# Patient Record
Sex: Male | Born: 1985 | Race: White | Hispanic: No | Marital: Married | Smoking: Former smoker
Health system: Southern US, Community
[De-identification: ages and names within clinical notes are randomized; demographics above are authoritative.]

## PROBLEM LIST (undated history)

## (undated) DIAGNOSIS — R7303 Prediabetes: Secondary | ICD-10-CM

## (undated) DIAGNOSIS — M8700 Idiopathic aseptic necrosis of unspecified bone: Secondary | ICD-10-CM

## (undated) DIAGNOSIS — Z87442 Personal history of urinary calculi: Secondary | ICD-10-CM

## (undated) DIAGNOSIS — G43909 Migraine, unspecified, not intractable, without status migrainosus: Secondary | ICD-10-CM

## (undated) DIAGNOSIS — F32A Depression, unspecified: Secondary | ICD-10-CM

## (undated) DIAGNOSIS — M1990 Unspecified osteoarthritis, unspecified site: Secondary | ICD-10-CM

## (undated) DIAGNOSIS — M457 Ankylosing spondylitis of lumbosacral region: Secondary | ICD-10-CM

## (undated) DIAGNOSIS — F1190 Opioid use, unspecified, uncomplicated: Secondary | ICD-10-CM

## (undated) DIAGNOSIS — G894 Chronic pain syndrome: Secondary | ICD-10-CM

## (undated) DIAGNOSIS — M25551 Pain in right hip: Secondary | ICD-10-CM

## (undated) DIAGNOSIS — F419 Anxiety disorder, unspecified: Secondary | ICD-10-CM

## (undated) HISTORY — PX: HIP SURGERY: SHX245

## (undated) HISTORY — DX: Migraine, unspecified, not intractable, without status migrainosus: G43.909

## (undated) HISTORY — PX: ADENOIDECTOMY: SUR15

---
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
## 2012-01-28 ENCOUNTER — Emergency Department: Payer: Self-pay | Admitting: Emergency Medicine

## 2013-01-21 ENCOUNTER — Emergency Department: Payer: Self-pay | Admitting: Emergency Medicine

## 2013-01-23 ENCOUNTER — Emergency Department: Payer: Self-pay | Admitting: Emergency Medicine

## 2013-01-23 LAB — CBC WITH DIFFERENTIAL/PLATELET
Basophil %: 0.7 %
Eosinophil #: 0 10*3/uL (ref 0.0–0.7)
Eosinophil %: 0.6 %
HCT: 46 % (ref 40.0–52.0)
HGB: 15.6 g/dL (ref 13.0–18.0)
Lymphocyte #: 3.2 10*3/uL (ref 1.0–3.6)
Lymphocyte %: 42.6 %
MCHC: 33.8 g/dL (ref 32.0–36.0)
Monocyte #: 0.4 x10 3/mm (ref 0.2–1.0)
Monocyte %: 5.5 %
Neutrophil #: 3.8 10*3/uL (ref 1.4–6.5)
Neutrophil %: 50.6 %
Platelet: 159 10*3/uL (ref 150–440)
RBC: 5.32 10*6/uL (ref 4.40–5.90)
RDW: 13.4 % (ref 11.5–14.5)

## 2013-01-23 LAB — DRUG SCREEN, URINE
Amphetamines, Ur Screen: NEGATIVE (ref ?–1000)
Cannabinoid 50 Ng, Ur ~~LOC~~: NEGATIVE (ref ?–50)
Opiate, Ur Screen: NEGATIVE (ref ?–300)
Phencyclidine (PCP) Ur S: NEGATIVE (ref ?–25)
Tricyclic, Ur Screen: NEGATIVE (ref ?–1000)

## 2013-01-23 LAB — BASIC METABOLIC PANEL
Anion Gap: 4 — ABNORMAL LOW (ref 7–16)
Calcium, Total: 9.4 mg/dL (ref 8.5–10.1)
Chloride: 107 mmol/L (ref 98–107)
Co2: 30 mmol/L (ref 21–32)
Creatinine: 1.11 mg/dL (ref 0.60–1.30)
EGFR (African American): >60
Glucose: 92 mg/dL (ref 65–99)
Osmolality: 280 (ref 275–301)
Potassium: 3.8 mmol/L (ref 3.5–5.1)

## 2013-01-23 LAB — URINALYSIS, COMPLETE
Bacteria: NONE SEEN
Bilirubin,UR: NEGATIVE
Leukocyte Esterase: NEGATIVE
Nitrite: NEGATIVE
Ph: 7 (ref 4.5–8.0)
Specific Gravity: 1.005 (ref 1.003–1.030)

## 2013-01-24 ENCOUNTER — Emergency Department: Payer: Self-pay | Admitting: Emergency Medicine

## 2013-07-15 ENCOUNTER — Emergency Department: Payer: Self-pay | Admitting: Emergency Medicine

## 2013-08-25 ENCOUNTER — Emergency Department: Payer: Self-pay | Admitting: Emergency Medicine

## 2013-08-27 ENCOUNTER — Emergency Department: Payer: Self-pay | Admitting: Emergency Medicine

## 2013-09-01 ENCOUNTER — Emergency Department: Payer: Self-pay | Admitting: Emergency Medicine

## 2013-09-05 DIAGNOSIS — Z720 Tobacco use: Secondary | ICD-10-CM | POA: Insufficient documentation

## 2013-09-05 DIAGNOSIS — R519 Headache, unspecified: Secondary | ICD-10-CM | POA: Insufficient documentation

## 2013-09-05 DIAGNOSIS — G479 Sleep disorder, unspecified: Secondary | ICD-10-CM | POA: Insufficient documentation

## 2013-09-05 DIAGNOSIS — R51 Headache: Secondary | ICD-10-CM

## 2013-09-11 ENCOUNTER — Emergency Department: Payer: Self-pay | Admitting: Emergency Medicine

## 2013-10-02 ENCOUNTER — Emergency Department: Payer: Self-pay | Admitting: Emergency Medicine

## 2013-11-07 ENCOUNTER — Emergency Department: Payer: Self-pay | Admitting: Emergency Medicine

## 2013-11-07 LAB — CBC WITH DIFFERENTIAL/PLATELET
BASOS ABS: 0.1 10*3/uL (ref 0.0–0.1)
Basophil %: 0.5 %
EOS ABS: 0.1 10*3/uL (ref 0.0–0.7)
Eosinophil %: 0.6 %
HCT: 48.9 % (ref 40.0–52.0)
HGB: 16.3 g/dL (ref 13.0–18.0)
LYMPHS ABS: 3.5 10*3/uL (ref 1.0–3.6)
LYMPHS PCT: 21.7 %
MCH: 29.4 pg (ref 26.0–34.0)
MCHC: 33.3 g/dL (ref 32.0–36.0)
MCV: 88 fL (ref 80–100)
Monocyte #: 0.9 x10 3/mm (ref 0.2–1.0)
Monocyte %: 5.3 %
NEUTROS PCT: 71.9 %
Neutrophil #: 11.7 10*3/uL — ABNORMAL HIGH (ref 1.4–6.5)
PLATELETS: 196 10*3/uL (ref 150–440)
RBC: 5.56 10*6/uL (ref 4.40–5.90)
RDW: 14.4 % (ref 11.5–14.5)
WBC: 16.2 10*3/uL — AB (ref 3.8–10.6)

## 2013-11-07 LAB — COMPREHENSIVE METABOLIC PANEL
ALT: 15 U/L
AST: 10 U/L — AB (ref 15–37)
Albumin: 4.5 g/dL (ref 3.4–5.0)
Alkaline Phosphatase: 61 U/L
Anion Gap: 3 — ABNORMAL LOW (ref 7–16)
BUN: 4 mg/dL — AB (ref 7–18)
Bilirubin,Total: 0.6 mg/dL (ref 0.2–1.0)
CHLORIDE: 106 mmol/L (ref 98–107)
CO2: 31 mmol/L (ref 21–32)
Calcium, Total: 9.5 mg/dL (ref 8.5–10.1)
Creatinine: 1.23 mg/dL (ref 0.60–1.30)
Glucose: 102 mg/dL — ABNORMAL HIGH (ref 65–99)
OSMOLALITY: 276 (ref 275–301)
Potassium: 4.3 mmol/L (ref 3.5–5.1)
Sodium: 140 mmol/L (ref 136–145)
Total Protein: 7.4 g/dL (ref 6.4–8.2)

## 2013-11-07 LAB — URINALYSIS, COMPLETE
Bacteria: NONE SEEN
Bilirubin,UR: NEGATIVE
Blood: NEGATIVE
Glucose,UR: NEGATIVE mg/dL (ref 0–75)
KETONE: NEGATIVE
Leukocyte Esterase: NEGATIVE
Nitrite: NEGATIVE
Ph: 8 (ref 4.5–8.0)
Protein: NEGATIVE
Specific Gravity: 1.001 (ref 1.003–1.030)
WBC UR: NONE SEEN /HPF (ref 0–5)

## 2013-11-07 LAB — LIPASE, BLOOD: Lipase: 146 U/L (ref 73–393)

## 2013-12-18 ENCOUNTER — Ambulatory Visit: Payer: Self-pay

## 2013-12-25 ENCOUNTER — Ambulatory Visit: Payer: Self-pay | Admitting: Nurse Practitioner

## 2014-03-13 DIAGNOSIS — G43909 Migraine, unspecified, not intractable, without status migrainosus: Secondary | ICD-10-CM | POA: Insufficient documentation

## 2014-05-14 ENCOUNTER — Encounter
Admit: 2014-05-14 | Disposition: A | Discharge status: Home / Self Care | Payer: Self-pay | Attending: Orthopedic Surgery | Admitting: Orthopedic Surgery

## 2014-06-04 ENCOUNTER — Ambulatory Visit: Payer: BLUE CROSS/BLUE SHIELD | Attending: Orthopedic Surgery | Admitting: Physical Therapy

## 2014-06-04 ENCOUNTER — Encounter: Payer: Self-pay | Admitting: Physical Therapy

## 2014-06-04 DIAGNOSIS — R262 Difficulty in walking, not elsewhere classified: Secondary | ICD-10-CM

## 2014-06-04 DIAGNOSIS — M6281 Muscle weakness (generalized): Secondary | ICD-10-CM

## 2014-06-04 DIAGNOSIS — M25552 Pain in left hip: Secondary | ICD-10-CM | POA: Insufficient documentation

## 2014-06-04 NOTE — Therapy (Addendum)
[place] MAIN [facility] SERVICES [place], [place], [address] Phone: [phone]   Fax:  [phone]  Physical Therapy Treatment  Patient Details  Name: Carl Solis MRN: [id number] Date of Birth: 20-Oct-1985 Referring Provider:  Rozelle Logan, Lavon Paganini, MD  Encounter Date: 06/04/2014      PT End of Session - 06/04/14 1456    Visit Number 3   Number of Visits 16   Date for PT Re-Evaluation 07/09/14   PT Start Time 1254   PT Stop Time 1339   PT Time Calculation (min) 45 min   Activity Tolerance Patient tolerated treatment well   Behavior During Therapy [facility] for tasks assessed/performed      Past Medical History  Diagnosis Date  . Migraine     Past Surgical History  Procedure Laterality Date  . Adenoidectomy      There were no vitals filed for this visit.  Visit Diagnosis:  Difficulty in walking  Muscle weakness (generalized)      Subjective Assessment - 06/04/14 1307    Subjective Pt is very satisfied with his progress and states since his hip "popped" he has not had any further issues. Pt states he would like a home exercise program for maintenance, but is otherwise ready for discharge.    Pertinent History Pt had left hip decompression in February of 2016.   Patient Stated Goals Pt stated he was now walking the dogs and performing all activities of daily living that he was hoping for.    Currently in Pain? No/denies            [facility] PT Assessment - 06/04/14 0001    Assessment   Medical Diagnosis Left core decompression   Onset Date 02/12/14   Balance Screen   Has the patient fallen in the past 6 months No   Observation/Other Assessments   Lower Extremity Functional Scale  80   Squat   Comments Valgus noted throughout descent and ascent. Most notable in the transition from eccentric to concentric.    Lunges   Comments Pt with limited valgus, which improved with cuing.       Therapeutic Exercise  Pt  educated and observed in squat exercise. Cued for wider stance, and to complete goblet squat with trunk upright. Pt completed 12 x with body weight and 12 x with 8# dumbbell.  Pt educated in dumbbell deadlifts with 8# dumbbell. Pt tolerated well, and required cuing for hip hinging. 2 sets x 12 repetitions.  Forward lunges x 12 bilaterally. Cuing to minimize valgus in lead knee bilaterally. Side stepping with green band around ankles 2 sets of 2 repetitions of 10' side stepping bilaterally. Cuing for some knee flexion to activate hip abductors.  Standing hip abduction with green band x 12 bilaterally with cuing for glute med activation.                        PT Education - 06/04/14 1345    Education provided Yes   Education Details Exercise technique and demonstration with cuing for valgus prevention and weightbearing through heels.    Person(s) Educated Patient   Methods Explanation;Demonstration;Verbal cues;Handout   Comprehension Returned demonstration             PT Long Term Goals - 06/04/14 1502    PT LONG TERM GOAL #1   Title Patient will be independent with ascent/descent of 12 steps using single UE in step over step  pattern without LOB by 07/09/2014.   Status New   PT LONG TERM GOAL #2   Title Patient will be able to single leg stand to be able to begin jumping, running, and higher level ADL's by 07/09/2014.   Status Achieved   PT LONG TERM GOAL #3   Title Pt will ambulate > 1000 feet with least restrictive assistive device with supervision for safety with limited community ambulation distances by 07/09/2014.    Status Achieved               Plan - 06/04/14 1458    Clinical Impression Statement Pt states he no longer has any impairments with his ADLs and is able to complete all of his day to day activities with no additional pain. Pt displays excellent hip mobility in functional activities such as squats (to full ROM), deadlifts, and lunges. Pt appears  to have made a complete recovery (he states he feels better now than he did before the surgery). Pt plans on discharging after this session.    Pt will benefit from skilled therapeutic intervention in order to improve on the following deficits Abnormal gait;Difficulty walking;Decreased balance;Decreased strength   Rehab Potential Excellent   PT Frequency 2x / week   PT Duration 8 weeks   PT Treatment/Interventions Manual techniques;Therapeutic exercise;Therapeutic activities   PT Next Visit Plan Pt is discharging after this visit. If he returns, progress with current HEP as tolerated.    PT Home Exercise Plan See above attachments. Lunges, squats, deadlifts, band resisted side stepping, and band resisted hip abductions.    Consulted and Agree with Plan of Care Patient        Problem List There are no active problems to display for this patient.   Kerman Passey, PT, [place] MAIN [facility] SERVICES [address] [place], [place], [address] Phone: [phone]   Fax:  [phone]   .OPPTDC

## 2014-06-04 NOTE — Patient Instructions (Signed)
Band Walk: Side Stepping   Tie band around legs, just above knees. Step ___ feet to one side, then step back to start. Repeat ___ feet per session. Note: Small towel between band and skin eases rubbing. (Can put the band around your ankles to increase the intensity).  http://plyo.exer.us/76   Copyright  VHI. All rights reserved.  (Clinic) Balance: With Hip Abduction - Unilateral Stance (Resist)   Opposite side toward pulley, strap around left ankle, leg in. Swing leg across body and out to side. Use arm support only as needed for balance. Repeat ____ times per set. Do ____ sets per session. Do ____ sessions per week. JUST ADD A BAND AROUND YOUR ANKLES, KICK OUT SIDEWAYS AGAINST THE BAND MAKING SURE YOUR TOES POINT FORWARDS.  Copyright  VHI. All rights reserved.  Hip Extension: Hamstring Single Leg Deadlift (Eccentric)   Holding weights, stand on affected leg with knee slightly flexed. Lift other leg while slowly bending forward at the hip. Use ___ lb weight. ___ reps per set, ___ sets per day, ___ days per week. Add ___ lbs when you achieve ___ repetitions. Touch floor with weight.  CAN ATTEMPT THIS, WOULD PREFER AT FIRST TO DO DOUBLE LEG VERSION. USE A LIGHT WEIGHT, PUSH YOUR HIPS BACKWARDS KEEPING YOUR SHINS VERTICAL. CAN FIND THIS ACTIVITY ONLINE ON YOUTUBE.   Copyright  VHI. All rights reserved.  Body-Weight Forward Lunge: Stable - Stationary (Active)   Stand in wide stride, legs shoulder width apart, head up, back flat. Bend both legs simultaneously until forward thigh is parallel to floor. Complete __2_ sets of _10__ repetitions. Perform _1__ sessions per day.  Copyright  VHI. All rights reserved.    Copyright  VHI. All rights reserved.  Deep Squat   Stand with feet shoulder width apart and squat deeply, head and chest up. CAN HOLD A WEIGHT UP, CALLED THE GOBLET SQUAT.  Repeat _10-12___ times per set. Do __2-3__ sets per session. Do __1__ sessions per  day.  http://orth.exer.us/738   Copyright  VHI. All rights reserved.

## 2014-06-05 NOTE — Therapy (Signed)
[place] MAIN [facility] SERVICES [place], [place], [address] Phone: [phone]   Fax:  [phone]  Physical Therapy Treatment  Patient Details  Name: Carl Solis MRN: [id number] Date of Birth: [birth year] Referring Provider:  Rozelle Logan, Lavon Paganini, MD  Encounter Date: 06/04/2014      PT End of Session - 06/04/14 1456    Visit Number 3   Number of Visits 16   Date for PT Re-Evaluation 07/09/14   PT Start Time 1254   PT Stop Time 1339   PT Time Calculation (min) 45 min   Activity Tolerance Patient tolerated treatment well   Behavior During Therapy [facility] for tasks assessed/performed      Past Medical History  Diagnosis Date  . Migraine     Past Surgical History  Procedure Laterality Date  . Adenoidectomy      There were no vitals filed for this visit.  Visit Diagnosis:  Difficulty in walking  Muscle weakness (generalized)      Subjective Assessment - 06/04/14 1307    Subjective Pt is very satisfied with his progress and states since his hip "popped" he has not had any further issues. Pt states he would like a home exercise program for maintenance, but is otherwise ready for discharge.    Pertinent History Pt had left hip decompression in February of 2016.   Patient Stated Goals Pt stated he was now walking the dogs and performing all activities of daily living that he was hoping for.    Currently in Pain? No/denies                                 PT Education - 06/04/14 1345    Education provided Yes   Education Details Exercise technique and demonstration with cuing for valgus prevention and weightbearing through heels.    Person(s) Educated Patient   Methods Explanation;Demonstration;Verbal cues;Handout   Comprehension Returned demonstration             PT Long Term Goals - 06/04/14 1502    PT LONG TERM GOAL #1   Title Patient will be independent with ascent/descent of 12 steps  using single UE in step over step pattern without LOB by 07/09/2014.   Status Achieved   PT LONG TERM GOAL #2   Title Patient will be able to single leg stand to be able to begin jumping, running, and higher level ADL's by 07/09/2014.   Status Achieved   PT LONG TERM GOAL #3   Title Pt will ambulate > 1000 feet with least restrictive assistive device with supervision for safety with limited community ambulation distances by 07/09/2014.    Status Achieved               Plan - 06/04/14 1458    Clinical Impression Statement Pt states he no longer has any impairments with his ADLs and is able to complete all of his day to day activities with no additional pain. Pt displays excellent hip mobility in functional activities such as squats (to full ROM), deadlifts, and lunges. Pt appears to have made a complete recovery (he states he feels better now than he did before the surgery). Pt plans on discharging after this session.    Pt will benefit from skilled therapeutic intervention in order to improve on the following deficits Abnormal gait;Difficulty walking;Decreased balance;Decreased strength   Rehab Potential Excellent   PT Frequency  2x / week   PT Duration 8 weeks   PT Treatment/Interventions Manual techniques;Therapeutic exercise;Therapeutic activities   PT Next Visit Plan Pt is discharging after this visit. If he returns, progress with current HEP as tolerated.    PT Home Exercise Plan See above attachments. Lunges, squats, deadlifts, band resisted side stepping, and band resisted hip abductions.    Consulted and Agree with Plan of Care Patient        Problem List There are no active problems to display for this patient.  PHYSICAL THERAPY DISCHARGE SUMMARY  Visits from Start of Care: 3  Current functional level related to goals / functional outcomes: LEFS 80/80   Remaining deficits: None noted by patient.    Education / Equipment: Home exercise program provided for maintenance.   Plan: Patient agrees to discharge.  Patient goals were met. Patient is being discharged due to being pleased with the current functional level.  ?????       Kerman Passey, PT, DPT  .  [place] MAIN [facility] SERVICES [address] [place], [place], [address] Phone: [phone]   Fax:  [phone]

## 2014-06-06 ENCOUNTER — Ambulatory Visit: Payer: BLUE CROSS/BLUE SHIELD | Admitting: Physical Therapy

## 2014-06-11 ENCOUNTER — Ambulatory Visit: Payer: BLUE CROSS/BLUE SHIELD | Admitting: Physical Therapy

## 2014-06-13 ENCOUNTER — Ambulatory Visit: Payer: BLUE CROSS/BLUE SHIELD | Admitting: Physical Therapy

## 2014-06-18 ENCOUNTER — Ambulatory Visit: Payer: BLUE CROSS/BLUE SHIELD | Admitting: Physical Therapy

## 2014-06-20 ENCOUNTER — Ambulatory Visit: Payer: BLUE CROSS/BLUE SHIELD | Admitting: Physical Therapy

## 2014-06-25 ENCOUNTER — Ambulatory Visit: Payer: BLUE CROSS/BLUE SHIELD | Admitting: Physical Therapy

## 2014-06-28 ENCOUNTER — Ambulatory Visit: Payer: BLUE CROSS/BLUE SHIELD | Admitting: Physical Therapy

## 2014-07-03 ENCOUNTER — Ambulatory Visit: Payer: BLUE CROSS/BLUE SHIELD | Admitting: Physical Therapy

## 2014-07-05 ENCOUNTER — Ambulatory Visit: Payer: BLUE CROSS/BLUE SHIELD | Admitting: Physical Therapy

## 2014-07-25 DIAGNOSIS — F419 Anxiety disorder, unspecified: Secondary | ICD-10-CM | POA: Insufficient documentation

## 2014-08-29 ENCOUNTER — Other Ambulatory Visit: Payer: Self-pay | Admitting: Pain Medicine

## 2014-08-29 DIAGNOSIS — M5481 Occipital neuralgia: Secondary | ICD-10-CM

## 2014-08-29 DIAGNOSIS — M25511 Pain in right shoulder: Secondary | ICD-10-CM

## 2014-08-29 DIAGNOSIS — G508 Other disorders of trigeminal nerve: Secondary | ICD-10-CM

## 2014-08-29 DIAGNOSIS — M542 Cervicalgia: Secondary | ICD-10-CM

## 2014-09-05 ENCOUNTER — Ambulatory Visit
Admission: RE | Admit: 2014-09-05 | Discharge: 2014-09-05 | Disposition: A | Discharge status: Home / Self Care | Payer: BLUE CROSS/BLUE SHIELD | Source: Ambulatory Visit | Attending: Pain Medicine | Admitting: Pain Medicine

## 2014-09-05 DIAGNOSIS — M25511 Pain in right shoulder: Secondary | ICD-10-CM | POA: Diagnosis present

## 2014-09-05 DIAGNOSIS — G508 Other disorders of trigeminal nerve: Secondary | ICD-10-CM | POA: Insufficient documentation

## 2014-09-05 DIAGNOSIS — M5481 Occipital neuralgia: Secondary | ICD-10-CM | POA: Diagnosis not present

## 2014-09-05 DIAGNOSIS — M47892 Other spondylosis, cervical region: Secondary | ICD-10-CM | POA: Diagnosis not present

## 2014-09-05 DIAGNOSIS — M542 Cervicalgia: Secondary | ICD-10-CM

## 2014-10-24 ENCOUNTER — Other Ambulatory Visit: Payer: Self-pay | Admitting: Student

## 2014-10-24 DIAGNOSIS — M25511 Pain in right shoulder: Secondary | ICD-10-CM

## 2014-10-24 DIAGNOSIS — M7581 Other shoulder lesions, right shoulder: Secondary | ICD-10-CM

## 2014-10-31 ENCOUNTER — Ambulatory Visit
Admission: RE | Admit: 2014-10-31 | Discharge: 2014-10-31 | Disposition: A | Discharge status: Home / Self Care | Payer: BLUE CROSS/BLUE SHIELD | Source: Ambulatory Visit | Attending: Student | Admitting: Student

## 2014-10-31 DIAGNOSIS — M7581 Other shoulder lesions, right shoulder: Secondary | ICD-10-CM | POA: Insufficient documentation

## 2014-10-31 DIAGNOSIS — M25511 Pain in right shoulder: Secondary | ICD-10-CM | POA: Diagnosis present

## 2015-02-01 ENCOUNTER — Emergency Department
Admission: EM | Admit: 2015-02-01 | Discharge: 2015-02-01 | Disposition: A | Discharge status: Home / Self Care | Payer: BLUE CROSS/BLUE SHIELD | Attending: Emergency Medicine | Admitting: Emergency Medicine

## 2015-02-01 ENCOUNTER — Encounter: Payer: Self-pay | Admitting: Emergency Medicine

## 2015-02-01 DIAGNOSIS — F17200 Nicotine dependence, unspecified, uncomplicated: Secondary | ICD-10-CM | POA: Diagnosis not present

## 2015-02-01 DIAGNOSIS — Z7951 Long term (current) use of inhaled steroids: Secondary | ICD-10-CM | POA: Diagnosis not present

## 2015-02-01 DIAGNOSIS — R51 Headache: Secondary | ICD-10-CM | POA: Diagnosis present

## 2015-02-01 DIAGNOSIS — G43709 Chronic migraine without aura, not intractable, without status migrainosus: Secondary | ICD-10-CM | POA: Diagnosis not present

## 2015-02-01 DIAGNOSIS — Z91040 Latex allergy status: Secondary | ICD-10-CM | POA: Diagnosis not present

## 2015-02-01 HISTORY — DX: Idiopathic aseptic necrosis of unspecified bone: M87.00

## 2015-02-01 HISTORY — DX: Migraine, unspecified, not intractable, without status migrainosus: G43.909

## 2015-02-01 MED ORDER — PROCHLORPERAZINE EDISYLATE 5 MG/ML IJ SOLN
10.0000 mg | Freq: Four times a day (QID) | INTRAMUSCULAR | Status: DC | PRN
  Administered 2015-02-01: 10 mg via INTRAVENOUS
  Filled 2015-02-01: qty 2

## 2015-02-01 MED ORDER — SODIUM CHLORIDE 0.9 % IV BOLUS (SEPSIS)
1000.0000 mL | Freq: Once | INTRAVENOUS | Status: AC
  Administered 2015-02-01: 1000 mL via INTRAVENOUS

## 2015-02-01 MED ORDER — HYDROMORPHONE HCL 1 MG/ML IJ SOLN
1.0000 mg | INTRAMUSCULAR | Status: AC
  Administered 2015-02-01: 1 mg via INTRAVENOUS
  Filled 2015-02-01: qty 1

## 2015-02-01 NOTE — ED Notes (Signed)
Pt states "i have a migraine". Pt states similar to previous migraines. Pt states has had nausea. No nuchal rigidity noted. Pt states has photophobia and sensitivity to sound.

## 2015-02-01 NOTE — ED Notes (Addendum)
Pt requesting something for "pain". No orders received from dr. Archie Balboa or dr. Kerman Passey after notifying md.

## 2015-02-01 NOTE — Discharge Instructions (Signed)
No driving tonight.  You have been seen in the Emergency Department (ED) for a headache.    As we have discussed, please follow up with your primary care doctor as soon as possible regarding todays Emergency Department (ED) visit and your headache symptoms.    Call your doctor or return to the ED if you have a worsening headache, sudden and severe headache, confusion, slurred speech, facial droop, weakness or numbness in any arm or leg, extreme fatigue, vision problems, or other symptoms that concern you.   Migraine Headache A migraine headache is an intense, throbbing pain on one or both sides of your head. A migraine can last for 30 minutes to several hours. CAUSES  The exact cause of a migraine headache is not always known. However, a migraine may be caused when nerves in the brain become irritated and release chemicals that cause inflammation. This causes pain. Certain things may also trigger migraines, such as:  Alcohol.  Smoking.  Stress.  Menstruation.  Aged cheeses.  Foods or drinks that contain nitrates, glutamate, aspartame, or tyramine.  Lack of sleep.  Chocolate.  Caffeine.  Hunger.  Physical exertion.  Fatigue.  Medicines used to treat chest pain (nitroglycerine), birth control pills, estrogen, and some blood pressure medicines. SIGNS AND SYMPTOMS  Pain on one or both sides of your head.  Pulsating or throbbing pain.  Severe pain that prevents daily activities.  Pain that is aggravated by any physical activity.  Nausea, vomiting, or both.  Dizziness.  Pain with exposure to bright lights, loud noises, or activity.  General sensitivity to bright lights, loud noises, or smells. Before you get a migraine, you may get warning signs that a migraine is coming (aura). An aura may include:  Seeing flashing lights.  Seeing bright spots, halos, or zigzag lines.  Having tunnel vision or blurred vision.  Having feelings of numbness or  tingling.  Having trouble talking.  Having muscle weakness. DIAGNOSIS  A migraine headache is often diagnosed based on:  Symptoms.  Physical exam.  A CT scan or MRI of your head. These imaging tests cannot diagnose migraines, but they can help rule out other causes of headaches. TREATMENT Medicines may be given for pain and nausea. Medicines can also be given to help prevent recurrent migraines.  HOME CARE INSTRUCTIONS  Only take over-the-counter or prescription medicines for pain or discomfort as directed by your health care provider. The use of long-term narcotics is not recommended.  Lie down in a dark, quiet room when you have a migraine.  Keep a journal to find out what may trigger your migraine headaches. For example, write down:  What you eat and drink.  How much sleep you get.  Any change to your diet or medicines.  Limit alcohol consumption.  Quit smoking if you smoke.  Get 7-9 hours of sleep, or as recommended by your health care provider.  Limit stress.  Keep lights dim if bright lights bother you and make your migraines worse. SEEK IMMEDIATE MEDICAL CARE IF:   Your migraine becomes severe.  You have a fever.  You have a stiff neck.  You have vision loss.  You have muscular weakness or loss of muscle control.  You start losing your balance or have trouble walking.  You feel faint or pass out.  You have severe symptoms that are different from your first symptoms. MAKE SURE YOU:   Understand these instructions.  Will watch your condition.  Will get help right away if you  are not doing well or get worse.   This information is not intended to replace advice given to you by your health care provider. Make sure you discuss any questions you have with your health care provider.   Document Released: 01/19/2005 Document Revised: 02/09/2014 Document Reviewed: 09/26/2012 Elsevier Interactive Patient Education [employer].

## 2015-02-01 NOTE — ED Notes (Signed)
perrl 65mm and brisk.

## 2015-02-03 NOTE — ED Provider Notes (Signed)
Please note I saw the patient yesterday at approximately 8 PM.  [facility] Emergency Department Provider Note REMINDER - THIS NOTE IS NOT A FINAL MEDICAL RECORD UNTIL IT IS SIGNED. UNTIL THEN, THE CONTENT BELOW MAY REFLECT INFORMATION FROM A DOCUMENTATION TEMPLATE, NOT THE ACTUAL PATIENT VISIT. ____________________________________________  Time seen: Approximately 8:01 pm  I have reviewed the triage vital signs and the nursing notes.   HISTORY  Chief Complaint Headache    HPI Carl Solis is a 30 y.o. male presents for evaluation of a headache. He reports a long-standing history of chronic migraines. He reports that he is having his typical migraine symptoms which included a severe throbbing pain over the left side of the face. This does not affect his eye. No vision changes. No numbness tingling weakness neck pain recent fevers chills or other concerns.  Patient tells me that he has a long history of migraines and has been evaluated with MRIs, and extensive workup with multiple evaluations via [facility] neurology.  He reports that he usually uses intranasal spray when symptoms occur, but currently his pharmacy does not have any. He reports that this is not the worst headache he's ever had, that it seemed to get worse today but he has a almost daily baseline migraine. He's been having symptoms since age 5. This is typical for his migraines.   Past Medical History  Diagnosis Date  . Migraine   . Migraines   . Avascular necrosis ([place])     There are no active problems to display for this patient.   Past Surgical History  Procedure Laterality Date  . Adenoidectomy      Current Outpatient Rx  Name  Route  Sig  Dispense  Refill  . fluticasone (FLONASE) 50 MCG/ACT nasal spray   Each Nare   Place into both nostrils daily.         Marland Kitchen oxycodone-acetaminophen (LYNOX) 10-300 MG per tablet   Oral   Take 1 tablet by mouth every 8 (eight) hours as needed  for pain.           Allergies Latex; Toradol; and Triptans  No family history on file.  Social History Social History  Substance Use Topics  . Smoking status: Current Every Day Smoker -- 0.50 packs/day for 10 years  . Smokeless tobacco: Never Used  . Alcohol Use: Yes    Review of Systems Constitutional: No fever/chills Eyes: No visual changes. ENT: No sore throat. Cardiovascular: Denies chest pain. Respiratory: Denies shortness of breath. Gastrointestinal: No abdominal pain.  No nausea, no vomiting.  No diarrhea.  No constipation. Genitourinary: Negative for dysuria. Musculoskeletal: Negative for back pain. Skin: Negative for rash. Neurological: Negative for focal weakness or numbness.  10-point ROS otherwise negative.  ____________________________________________   PHYSICAL EXAM:  VITAL SIGNS: ED Triage Vitals  Enc Vitals Group     BP 02/01/15 1923 114/72 mmHg     Pulse Rate 02/01/15 1923 119     Resp 02/01/15 1923 18     Temp 02/01/15 1923 98.2 F (36.8 C)     Temp Source 02/01/15 1923 Oral     SpO2 02/01/15 1923 97 %     Weight 02/01/15 1923 143 lb (64.864 kg)     Height 02/01/15 1923 5\' 10"  (1.778 m)     Head Cir --      Peak Flow --      Pain Score 02/01/15 1924 10     Pain Loc --  Pain Edu? --      Excl. in [place]? --    Constitutional: Alert and oriented. Well appearing and in no acute distress. Does appear in pain. He is in the room with lights off. Eyes: Conjunctivae are normal. PERRL. EOMI. the patient has mild photophobia. Head: Atraumatic. Nose: No congestion/rhinnorhea. Mouth/Throat: Mucous membranes are moist.  Oropharynx non-erythematous. Neck: No stridor.  No meningismus. Cardiovascular: Normal rate, regular rhythm. Grossly normal heart sounds.  Good peripheral circulation. Respiratory: Normal respiratory effort.  No retractions. Lungs CTAB. Gastrointestinal: Soft and nontender. No distention. No abdominal bruits. No CVA  tenderness. Musculoskeletal: No lower extremity tenderness nor edema.  No joint effusions. Neurologic:  Normal speech and language. No gross focal neurologic deficits are appreciated. No pronator drift. Normal cranial nerve exam. Normal sensation in all extremities. Walks with a normal and stable gait. No gait instability. Skin:  Skin is warm, dry and intact. No rash noted. Psychiatric: Mood and affect are normal. Speech and behavior are normal.  ____________________________________________   LABS (all labs ordered are listed, but only abnormal results are displayed)  Labs Reviewed - No data to display ____________________________________________  EKG   ____________________________________________  RADIOLOGY   ____________________________________________   PROCEDURES  Procedure(s) performed: None  Critical Care performed: No  ____________________________________________   INITIAL IMPRESSION / ASSESSMENT AND PLAN / ED COURSE  Pertinent labs & imaging results that were available during my care of the patient were reviewed by me and considered in my medical decision making (see chart for details).  Patient resents for evaluation of migraines. Reports a long history of chronic and recurrent similar headaches. She reports MRIs and very thorough workup done through his neurologist in [place]. At the present time, he has no evidence of neurologic deficit. No signs or symptoms to suggest the need for repeat imaging, or signs of infection/meningitis. He is overall well appearing except as appear in pain.  After receiving pain medication, the patient reports his pain is much better. He is awake alert and request to be discharged. Discharge him into the care of his partner, who will be driving him home. He will follow up closely with Dr. Doy Hutching and with his neurologist. Return precautions discussed and provided. ____________________________________________   FINAL CLINICAL  IMPRESSION(S) / ED DIAGNOSES  Final diagnoses:  Chronic migraine without aura without status migrainosus, not intractable      Delman Kitten, MD 02/03/15 [phone]

## 2015-02-10 ENCOUNTER — Emergency Department
Admission: EM | Admit: 2015-02-10 | Discharge: 2015-02-10 | Disposition: A | Discharge status: Home / Self Care | Payer: BLUE CROSS/BLUE SHIELD | Attending: Student | Admitting: Student

## 2015-02-10 DIAGNOSIS — F17200 Nicotine dependence, unspecified, uncomplicated: Secondary | ICD-10-CM | POA: Insufficient documentation

## 2015-02-10 DIAGNOSIS — Z7951 Long term (current) use of inhaled steroids: Secondary | ICD-10-CM | POA: Insufficient documentation

## 2015-02-10 DIAGNOSIS — M87052 Idiopathic aseptic necrosis of left femur: Secondary | ICD-10-CM

## 2015-02-10 DIAGNOSIS — Z91040 Latex allergy status: Secondary | ICD-10-CM | POA: Diagnosis not present

## 2015-02-10 DIAGNOSIS — M87821 Other osteonecrosis, right humerus: Secondary | ICD-10-CM | POA: Diagnosis not present

## 2015-02-10 DIAGNOSIS — M25511 Pain in right shoulder: Secondary | ICD-10-CM | POA: Diagnosis present

## 2015-02-10 DIAGNOSIS — M87852 Other osteonecrosis, left femur: Secondary | ICD-10-CM | POA: Diagnosis not present

## 2015-02-10 DIAGNOSIS — M87029 Idiopathic aseptic necrosis of unspecified humerus: Secondary | ICD-10-CM

## 2015-02-10 MED ORDER — OXYCODONE HCL ER 10 MG PO T12A
30.0000 mg | EXTENDED_RELEASE_TABLET | Freq: Two times a day (BID) | ORAL | Status: DC
  Administered 2015-02-10: 30 mg via ORAL
  Filled 2015-02-10: qty 3
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
## 2015-02-10 NOTE — Discharge Instructions (Signed)
Avascular Necrosis °Avascular necrosis is a disease resulting from the temporary or permanent loss of blood supply to a bone. This disease may also be known as:  °· Osteonecrosis.   °· Aseptic necrosis.   °· Ischemic bone necrosis. °Without proper blood supply, the internal layer of the affected bone dies and the outer layer of the bone may break down. If this process affects a bone near a joint, it may lead to collapse of that joint. Common bones that are affected by this condition include: °·  The top of your thigh bone (femoral head). °·  One or more bones in your wrist (scaphoid or lunate). °·  One or more bones in your foot (metatarsals). °·  One of the bones in your ankle (navicular). °The joint most commonly affected by this condition is the hip joint. °Avascular necrosis rarely occurs in more than one bone at a time.  °CAUSES  °· Damage or injury to a bone or joint. °· Using corticosteroid medicine for a long period of time. °· Changes in your immune or hormone systems. °· Excessive exposure to radiation. °RISK FACTORS °· Alcohol abuse. °· Previous traumatic injury to a joint. °· Using corticosteroid medicines for a long period of time or often. °· Having a medical condition such as: °¨ HIV or AIDS.   °¨ Diabetes.   °¨ Sickle cell disease. °¨ An autoimmune disease. °SIGNS AND SYMPTOMS  °The main symptoms of avascular necrosis are pain and decreased motion in the affected bone or joint. In the early stages the pain may be minor and occur only with activity. As avascular necrosis progresses, pain may gradually worsen and occur while at rest. The pain may suddenly become severe if an affected joint collapses. °DIAGNOSIS  °Avascular necrosis may be diagnosed with:  °· A medical history. °· A physical exam.   °· X-rays. °· An MRI. °· A bone scan. °TREATMENT  °Treatments may include: °· Medicine to help relieve pain. °· Avoiding placing any pressure or weight on the affected area. If avascular necrosis occurs in  your hip, ankle, or foot, you may be instructed to use crutches or a rolling scooter. °· Surgery, such as:   °¨ Core decompression. In this surgery, one or more holes are placed in the bone for new blood vessels to grow into. This provides a renewed blood supply to the bone. Core decompression can often reduce pain and pressure in the affected bone and slow the progression of bone and joint destruction. °¨ Osteotomy. In this surgery, the bone is reshaped to reduce stress on the affected area of the joint.   °¨ Bone grafting. In this surgery, healthy bone from one part of your body is transplanted to the affected area.   °¨ Arthroplasty. Arthroplasty is also known as total joint replacement. In this surgery, the affected surface on one or both sides of a joint is replaced with artificial parts (prostheses). °· Electrical stimulation. This may help encourage new bone growth. °HOME CARE INSTRUCTIONS °· Take medicines only as directed by your health care provider.   °· Follow your health care provider's recommendations on limiting activities or using crutches to rest your affected joint.   °· Meet with a physical therapist as directed by your health care provider.   °· Keep all follow-up visits as directed by your health care provider. This is important. °SEEK MEDICAL CARE IF:  °· Your pain worsens. °· You have decreased motion in your affected joint. °SEEK IMMEDIATE MEDICAL CARE IF:  °Your pain suddenly becomes severe. °  °This information is not intended to replace advice given to you   by your health care provider. Make sure you discuss any questions you have with your health care provider. °  °Document Released: 07/11/2001 Document Revised: 02/09/2014 Document Reviewed: 03/29/2013 °Elsevier Interactive Patient Education ©2016 [employer] Inc. ° °
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
## 2015-02-26 DIAGNOSIS — M8700 Idiopathic aseptic necrosis of unspecified bone: Secondary | ICD-10-CM | POA: Insufficient documentation

## 2015-05-10 ENCOUNTER — Ambulatory Visit (INDEPENDENT_AMBULATORY_CARE_PROVIDER_SITE_OTHER): Payer: BLUE CROSS/BLUE SHIELD | Admitting: Urology

## 2015-05-10 ENCOUNTER — Encounter: Payer: Self-pay | Admitting: Urology

## 2015-05-10 ENCOUNTER — Telehealth: Payer: Self-pay

## 2015-05-10 VITALS — BP 117/81 | HR 121 | Ht 70.0 in | Wt 159.3 lb

## 2015-05-10 DIAGNOSIS — E291 Testicular hypofunction: Secondary | ICD-10-CM

## 2015-05-10 DIAGNOSIS — N522 Drug-induced erectile dysfunction: Secondary | ICD-10-CM | POA: Diagnosis not present

## 2015-05-10 NOTE — Progress Notes (Signed)
05/10/2015 10:49 AM   Carl Solis 03/22/1985 [id number]  Referring provider: Idelle Crouch, MD [place] [facility] [place], [place] [address]  Chief Complaint  Patient presents with  . Erectile Dysfunction    Seen by PCP was told he needed to be evaluated by urology before he would perscribe Viagra     HPI: The patient is a 30 year old gentleman with a history of avascular necrosis on chronic opioids presents for evaluation of erectile dysfunction. He notes that he is unable to obtain erections sufficient for intercourse. He also has decreased libido and fatigue. His testosterone was checked by his primary care provider and was transferred to B6. At that time he had a prolactin, PSA, and TSH were also checked which were within normal limits.  Also note, patient has chronic migraines since age of 47. He is homosexual and has no interest in conceiving a child with his sperm due to his severe hereditary migraines which he would not want to pass on to a child. He has thought about this a lot and confident in his decision.  The patient goes by "[name]."  PMH: Past Medical History  Diagnosis Date  . Migraine   . Migraines   . Avascular necrosis [facility])     Surgical History: Past Surgical History  Procedure Laterality Date  . Adenoidectomy    . Hip surgery Left     Home Medications:    Medication List       This list is accurate as of: 05/10/15 10:49 AM.  Always use your most recent med list.               Butalbital-APAP-Caffeine 50-300-40 MG Caps     butorphanol 10 MG/ML nasal spray  Commonly known as:  STADOL     citalopram 40 MG tablet  Commonly known as:  CELEXA     diazepam 5 MG tablet  Commonly known as:  VALIUM     fluticasone 50 MCG/ACT nasal spray  Commonly known as:  FLONASE     LYRICA 150 MG capsule  Generic drug:  pregabalin  TK ONE C PO TID     ondansetron 4 MG disintegrating tablet  Commonly known as:  ZOFRAN-ODT  DIS 1 T  ON THE TONGUE Q 8 H PRN     oxycodone-acetaminophen 10-300 MG tablet  Commonly known as:  LYNOX  Take 1 tablet by mouth every 4 (four) hours as needed for pain.     OXYCONTIN 40 mg 12 hr tablet  Generic drug:  oxyCODONE  Take by mouth.     pantoprazole 40 MG tablet  Commonly known as:  PROTONIX  Take by mouth.     promethazine 12.5 MG tablet  Commonly known as:  PHENERGAN  Take by mouth.     tamsulosin 0.4 MG Caps capsule  Commonly known as:  FLOMAX  Reported on 05/10/2015     tiZANidine 4 MG capsule  Commonly known as:  ZANAFLEX  Take by mouth.        Allergies:  Allergies  Allergen Reactions  . D.H.E. [Dihydroergotamine] Swelling and Anaphylaxis    Lack Jaw, Rash, Hives  . Latex Hives and Rash  . Amoxicillin Diarrhea  . Dhea     Other reaction(s): Other (See Comments) "retain water in legs which prevents me from walking"  . Eletriptan     Other reaction(s): Other (See Comments) "lock jaw where I can't eat or drink"  . Pork-Derived Products  Other reaction(s): Headache  . Rizatriptan     Other reaction(s): Other (See Comments) Lock jaw, rash, hives  . Sulfa Antibiotics Diarrhea  . Sulfacetamide Sodium Diarrhea  . Sumatriptan Other (See Comments)    "lock jaw where I can't eat or drink"  . Toradol [Ketorolac Tromethamine]   . Triptans   . Bacitracin-Neomycin-Polymyxin Hives and Rash  . Ketorolac Hives and Rash  . Neosporin [Neomycin-Bacitracin Zn-Polymyx] Rash    Family History: Family History  Problem Relation Age of Onset  . Brain cancer Father   . Depression Mother   . Anxiety disorder Mother     Social History:  reports that he has been smoking.  He has never used smokeless tobacco. He reports that he drinks alcohol. He reports that he does not use illicit drugs.  ROS: UROLOGY Erection problems?: Yes                                Neurological Headaches?: Yes  Psychologic Anxiety?: Yes  Physical Exam: BP 117/81  mmHg  Pulse 121  Ht 5\' 10"  (1.778 m)  Wt 159 lb 4.8 oz (72.258 kg)  BMI 22.86 kg/m2  Constitutional:  Alert and oriented, No acute distress. HEENT: [place] AT, moist mucus membranes.  Trachea midline, no masses. Cardiovascular: No clubbing, cyanosis, or edema. Respiratory: Normal respiratory effort, no increased work of breathing. GI: Abdomen is soft, nontender, nondistended, no abdominal masses GU: No CVA tenderness. Normal phallus. Testicles descended equally bilaterally. No masses. Skin: No rashes, bruises or suspicious lesions. Lymph: No cervical or inguinal adenopathy. Neurologic: Walking with cane 2/2 avascular necrosis Psychiatric: Normal mood and affect.  Laboratory Data: Lab Results  Component Value Date   WBC 16.2* 11/07/2013   HGB 16.3 11/07/2013   HCT 48.9 11/07/2013   MCV 88 11/07/2013   PLT 196 11/07/2013    Lab Results  Component Value Date   CREATININE 1.23 11/07/2013    No results found for: PSA  No results found for: TESTOSTERONE  No results found for: HGBA1C  Urinalysis    Component Value Date/Time   COLORURINE Colorless 11/07/2013 1437   APPEARANCEUR Clear 11/07/2013 1437   LABSPEC 1.001 11/07/2013 1437   PHURINE 8.0 11/07/2013 1437   GLUCOSEU Negative 11/07/2013 1437   HGBUR Negative 11/07/2013 1437   BILIRUBINUR Negative 11/07/2013 1437   KETONESUR Negative 11/07/2013 1437   PROTEINUR Negative 11/07/2013 1437   NITRITE Negative 11/07/2013 1437   LEUKOCYTESUR Negative 11/07/2013 1437    Assessment & Plan:    1. Erectile Dysfunction I gave the patient samples of Stendra 200 mg. He will break the tablets in half. We will discuss the efficacy at his next appointment.  2. Hypogonadism The patient is a testosterone of 256 in the setting of normal TSH and prolactin. We will confirm a low T with an AM blood draw. We'll also check an LH and FSH at that time. The patient is not interested in conceiving children due to his hereditary migraines which he  does not want to pass on. Therefore, though he is young, he would be a candidate for testosterone instead of clomid. He is aware that testosterone would make him infertile.  Return for AM lab draw with appt one week later with me.  Nickie Retort, MD  [facility] [address] [place], [place] [address] [phone]

## 2015-05-10 NOTE — Telephone Encounter (Signed)
FYI just checking to see if you wanted the pt to get his Testosterone, TSH and LH at  A later date.

## 2015-05-22 ENCOUNTER — Other Ambulatory Visit: Payer: Self-pay

## 2015-05-22 DIAGNOSIS — E291 Testicular hypofunction: Secondary | ICD-10-CM

## 2015-05-23 ENCOUNTER — Other Ambulatory Visit: Payer: BLUE CROSS/BLUE SHIELD

## 2015-05-23 DIAGNOSIS — E291 Testicular hypofunction: Secondary | ICD-10-CM

## 2015-05-24 LAB — FSH/LH
FSH: 2 m[IU]/mL (ref 1.5–12.4)
LH: 2.6 m[IU]/mL (ref 1.7–8.6)

## 2015-05-24 LAB — TESTOSTERONE: TESTOSTERONE: 287 ng/dL — AB (ref 348–1197)

## 2015-05-30 ENCOUNTER — Encounter: Payer: Self-pay | Admitting: Urology

## 2015-05-30 ENCOUNTER — Ambulatory Visit (INDEPENDENT_AMBULATORY_CARE_PROVIDER_SITE_OTHER): Payer: BLUE CROSS/BLUE SHIELD | Admitting: Urology

## 2015-05-30 VITALS — BP 116/78 | HR 82 | Ht 70.0 in | Wt 161.2 lb

## 2015-05-30 DIAGNOSIS — N522 Drug-induced erectile dysfunction: Secondary | ICD-10-CM

## 2015-05-30 DIAGNOSIS — E291 Testicular hypofunction: Secondary | ICD-10-CM | POA: Diagnosis not present

## 2015-05-30 MED ORDER — TESTOSTERONE CYPIONATE 200 MG/ML IM SOLN: 100.0000 mg | INTRAMUSCULAR | Status: DC

## 2015-05-30 NOTE — Progress Notes (Signed)
05/30/2015 11:57 AM   Carl Solis 1985/04/06 [id number]  Referring provider: Idelle Crouch, MD [facility] [place],  [address]  Chief Complaint  Patient presents with  . Follow-up    hypogonadism    HPI: The patient is a 30 year old gentleman with a history of avascular necrosis on chronic opioids presents for evaluation of erectile dysfunction. He notes that he is unable to obtain erections sufficient for intercourse. He also has decreased libido and fatigue. His testosterone was checked by his primary care provider and was transferred to B6. At that time he had a prolactin, PSA, and TSH were also checked which were within normal limits.  Also note, patient has chronic migraines since age of 31. He is homosexual and has no interest in conceiving a child with his sperm due to his severe hereditary migraines which he would not want to pass on to a child. He has thought about this a lot and confident in his decision.  The patient goes by "Carl Solis."  Interval history: The patient returns today to discuss his a.m. labs. His testosterone was 287. His FSH and LH were normal.   PMH: Past Medical History  Diagnosis Date  . Migraine   . Migraines   . Avascular necrosis [facility])     Surgical History: Past Surgical History  Procedure Laterality Date  . Adenoidectomy    . Hip surgery Left     Home Medications:    Medication List       This list is accurate as of: 05/30/15 11:57 AM.  Always use your most recent med list.               Butalbital-APAP-Caffeine 50-300-40 MG Caps     butorphanol 10 MG/ML nasal spray  Commonly known as:  STADOL     citalopram 40 MG tablet  Commonly known as:  CELEXA     diazepam 5 MG tablet  Commonly known as:  VALIUM     fluticasone 50 MCG/ACT nasal spray  Commonly known as:  FLONASE     LYRICA 150 MG capsule  Generic drug:  pregabalin  TK ONE C PO TID     ondansetron 4 MG disintegrating tablet    Commonly known as:  ZOFRAN-ODT  DIS 1 T ON THE TONGUE Q 8 H PRN     oxycodone-acetaminophen 10-300 MG tablet  Commonly known as:  LYNOX  Take 1 tablet by mouth every 4 (four) hours as needed for pain. Reported on 05/30/2015     OXYCONTIN 40 mg 12 hr tablet  Generic drug:  oxyCODONE  Take by mouth.     Oxycodone HCl 10 MG Tabs     pantoprazole 40 MG tablet  Commonly known as:  PROTONIX  Take by mouth.     promethazine 12.5 MG tablet  Commonly known as:  PHENERGAN  Take by mouth.     tamsulosin 0.4 MG Caps capsule  Commonly known as:  FLOMAX  Reported on 05/30/2015     testosterone cypionate 200 MG/ML injection  Commonly known as:  DEPOTESTOSTERONE CYPIONATE  Inject 0.5 mLs (100 mg total) into the muscle every 14 (fourteen) days.     tiZANidine 4 MG capsule  Commonly known as:  ZANAFLEX  Take by mouth.        Allergies:  Allergies  Allergen Reactions  . D.H.E. [Dihydroergotamine] Swelling and Anaphylaxis    Lack Jaw, Rash, Hives  . Latex Hives and Rash  . Amoxicillin Diarrhea  .  Dhea     Other reaction(s): Other (See Comments) "retain water in legs which prevents me from walking"  . Eletriptan     Other reaction(s): Other (See Comments) "lock jaw where I can't eat or drink"  . Pork-Derived Products     Other reaction(s): Headache  . Rizatriptan     Other reaction(s): Other (See Comments) Lock jaw, rash, hives  . Sulfa Antibiotics Diarrhea  . Sulfacetamide Sodium Diarrhea  . Sumatriptan Other (See Comments)    "lock jaw where I can't eat or drink"  . Toradol [Ketorolac Tromethamine]   . Triptans   . Aspartame And Phenylalanine     headaches  . Bacitracin-Neomycin-Polymyxin Hives and Rash  . Ketorolac Hives and Rash  . Neosporin [Neomycin-Bacitracin Zn-Polymyx] Rash    Family History: Family History  Problem Relation Age of Onset  . Brain cancer Father   . Depression Mother   . Anxiety disorder Mother     Social History:  reports that he has been  smoking.  He has never used smokeless tobacco. He reports that he drinks alcohol. He reports that he does not use illicit drugs.  ROS: UROLOGY Frequent Urination?: No Hard to postpone urination?: No Burning/pain with urination?: No Get up at night to urinate?: No Leakage of urine?: No Urine stream starts and stops?: Yes Trouble starting stream?: No Do you have to strain to urinate?: Yes Blood in urine?: No Urinary tract infection?: No Sexually transmitted disease?: No Injury to kidneys or bladder?: No Painful intercourse?: No Weak stream?: No Erection problems?: Yes Penile pain?: No  Gastrointestinal Nausea?: No Vomiting?: No Indigestion/heartburn?: No Diarrhea?: No Constipation?: No  Constitutional Fever: No Night sweats?: No Weight loss?: No Fatigue?: No  Skin Skin rash/lesions?: No Itching?: No  Eyes Blurred vision?: No Double vision?: No  Ears/Nose/Throat Sore throat?: No Sinus problems?: No  Hematologic/Lymphatic Swollen glands?: No Easy bruising?: No  Cardiovascular Leg swelling?: No Chest pain?: No  Respiratory Cough?: No Shortness of breath?: No  Endocrine Excessive thirst?: No  Musculoskeletal Back pain?: No Joint pain?: Yes  Neurological Headaches?: Yes Dizziness?: No  Psychologic Depression?: No Anxiety?: Yes  Physical Exam: BP 116/78 mmHg  Pulse 82  Ht 5\' 10"  (1.778 m)  Wt 161 lb 3.2 oz (73.12 kg)  BMI 23.13 kg/m2  Constitutional:  Alert and oriented, No acute distress. HEENT: [place] AT, moist mucus membranes.  Trachea midline, no masses. Cardiovascular: No clubbing, cyanosis, or edema. Respiratory: Normal respiratory effort, no increased work of breathing. GI: Abdomen is soft, nontender, nondistended, no abdominal masses GU: No CVA tenderness.  Skin: No rashes, bruises or suspicious lesions. Lymph: No cervical or inguinal adenopathy. Neurologic: Grossly intact, no focal deficits, moving all 4 extremities. Psychiatric:  Normal mood and affect.  Laboratory Data: Lab Results  Component Value Date   WBC 16.2* 11/07/2013   HGB 16.3 11/07/2013   HCT 48.9 11/07/2013   MCV 88 11/07/2013   PLT 196 11/07/2013    Lab Results  Component Value Date   CREATININE 1.23 11/07/2013    No results found for: PSA  Lab Results  Component Value Date   TESTOSTERONE 287* 05/23/2015    No results found for: HGBA1C  Urinalysis    Component Value Date/Time   COLORURINE Colorless 11/07/2013 1437   APPEARANCEUR Clear 11/07/2013 1437   LABSPEC 1.001 11/07/2013 1437   PHURINE 8.0 11/07/2013 1437   GLUCOSEU Negative 11/07/2013 1437   HGBUR Negative 11/07/2013 1437   BILIRUBINUR Negative 11/07/2013 1437   KETONESUR  Negative 11/07/2013 1437   PROTEINUR Negative 11/07/2013 1437   NITRITE Negative 11/07/2013 1437   LEUKOCYTESUR Negative 11/07/2013 1437     Assessment & Plan:    1. Erectile Dysfunction I gave the patient samples of Stendra 200 mg. He will break the tablets in half. 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 He is interested in testosterone replacement this time. He again is strongly adamant that he will does not want to have children due to his hereditary migraines which she does not want to pass on. Therefore, he is a candidate for testosterone replacement. He understands the risks, benefits, indications of this medication. He understands this will make him infertile if he is not already. He understands the risk for cardiovascular events, stroke, and simulation prostate cancer. This is unlikely given his younger age. He was given a prescription for testosterone cypionate 100 mg every 2 weeks. He will have the medication filled and have the injections in office every 2 weeks. We'll check his testosterone 6 weeks. We'll check his CBC in testosterone 3 months at which time follow-up. -return next week  for first testosterone injection -check testosterone in 6 weeks -follow up 3 months with CBC, testosterone prior  Return for NV for injection next week, T level 6 wks, 3 months office visit.  Nickie Retort, MD  [facility] [address], [place], [place] [address] [phone]

## 2015-05-31 ENCOUNTER — Telehealth: Payer: Self-pay | Admitting: Urology

## 2015-05-31 NOTE — Telephone Encounter (Signed)
Carl Solis called saying we should've received a fax from his pharmacy telling us his testosterone needs an authorization. He wanted to make sure of this, please give him a call if needed.  Pt's ph# [phone] Thank you.

## 2015-06-04 NOTE — Telephone Encounter (Signed)
PA has been complete.

## 2015-06-06 ENCOUNTER — Ambulatory Visit: Payer: BLUE CROSS/BLUE SHIELD

## 2015-06-12 ENCOUNTER — Telehealth: Payer: Self-pay

## 2015-06-12 ENCOUNTER — Ambulatory Visit (INDEPENDENT_AMBULATORY_CARE_PROVIDER_SITE_OTHER): Payer: BLUE CROSS/BLUE SHIELD

## 2015-06-12 DIAGNOSIS — E291 Testicular hypofunction: Secondary | ICD-10-CM

## 2015-06-12 MED ORDER — TESTOSTERONE CYPIONATE 200 MG/ML IM SOLN
200.0000 mg | Freq: Once | INTRAMUSCULAR | Status: AC
  Administered 2015-06-12: 200 mg via INTRAMUSCULAR

## 2015-06-12 NOTE — Telephone Encounter (Signed)
PA for testosterone cypionate has been APPROVED until 06-10-16. Case ID [id number]

## 2015-06-12 NOTE — Progress Notes (Signed)
Testosterone IM Injection  Due to Hypogonadism patient is present today for a Testosterone Injection.  Medication: Testosterone Cypionate Dose: 0.97mL Location: right upper outer buttocks Lot: [id number] Exp:01/2017  Patient tolerated well, no complications were noted  Preformed by: Toniann Fail, LPN   Follow up: 2 weeks

## 2015-06-20 ENCOUNTER — Encounter: Payer: Self-pay | Admitting: Physical Therapy

## 2015-06-26 ENCOUNTER — Ambulatory Visit (INDEPENDENT_AMBULATORY_CARE_PROVIDER_SITE_OTHER): Payer: BLUE CROSS/BLUE SHIELD

## 2015-06-26 DIAGNOSIS — E291 Testicular hypofunction: Secondary | ICD-10-CM

## 2015-06-26 MED ORDER — TESTOSTERONE CYPIONATE 200 MG/ML IM SOLN
200.0000 mg | Freq: Once | INTRAMUSCULAR | Status: AC
  Administered 2015-06-26: 200 mg via INTRAMUSCULAR

## 2015-06-26 NOTE — Progress Notes (Signed)
Testosterone IM Injection  Due to Hypogonadism patient is present today for a Testosterone Injection.  Medication: Testosterone Cypionate Dose: 0.49mL Location: left upper outer buttocks Lot: [id number] Exp:01/2017  Patient tolerated well, no complications were noted  Preformed by: Toniann Fail, LPN   Follow up: pt will have labs drawn next week.

## 2015-07-03 ENCOUNTER — Other Ambulatory Visit: Payer: BLUE CROSS/BLUE SHIELD

## 2015-07-03 DIAGNOSIS — E291 Testicular hypofunction: Secondary | ICD-10-CM

## 2015-07-04 LAB — HEMATOCRIT: Hematocrit: 40.2 % (ref 37.5–51.0)

## 2015-07-04 LAB — TESTOSTERONE: Testosterone: 320 ng/dL — ABNORMAL LOW (ref 348–1197)

## 2015-07-05 ENCOUNTER — Telehealth: Payer: Self-pay

## 2015-07-05 DIAGNOSIS — E291 Testicular hypofunction: Secondary | ICD-10-CM

## 2015-07-05 MED ORDER — TESTOSTERONE CYPIONATE 200 MG/ML IM SOLN: INTRAMUSCULAR | Status: DC

## 2015-07-05 NOTE — Telephone Encounter (Signed)
Spoke with pt in reference testosterone injections. Pt stated that he would like to go to q10days. Medication was sent to pharmacy. Pt will RTC Monday for an injection.

## 2015-07-05 NOTE — Telephone Encounter (Signed)
-----   Message from Nickie Retort, MD sent at 07/04/2015  2:18 PM EDT ----- Please let patient know his testosterone is still in low range. We can increase his dosing interval to every ten days from every 2 weeks if he is interested. I saw him in the office last week after his shot and he said he was feeling much better, so he may not want to get the injection more often. It is up to him.

## 2015-07-08 ENCOUNTER — Ambulatory Visit (INDEPENDENT_AMBULATORY_CARE_PROVIDER_SITE_OTHER): Payer: BLUE CROSS/BLUE SHIELD

## 2015-07-08 DIAGNOSIS — E291 Testicular hypofunction: Secondary | ICD-10-CM

## 2015-07-08 MED ORDER — TESTOSTERONE CYPIONATE 200 MG/ML IM SOLN
200.0000 mg | Freq: Once | INTRAMUSCULAR | Status: AC
  Administered 2015-07-08: 200 mg via INTRAMUSCULAR

## 2015-07-08 NOTE — Progress Notes (Signed)
Testosterone IM Injection  Due to Hypogonadism patient is present today for a Testosterone Injection.  Medication: Testosterone Cypionate Dose: 0.46mL Location: right upper outer buttocks Lot: [id number] Exp:11/2016  Patient tolerated well, no complications were noted  Preformed by: Toniann Fail, LPN

## 2015-07-11 ENCOUNTER — Ambulatory Visit: Payer: BLUE CROSS/BLUE SHIELD

## 2015-07-18 ENCOUNTER — Ambulatory Visit (INDEPENDENT_AMBULATORY_CARE_PROVIDER_SITE_OTHER): Payer: BLUE CROSS/BLUE SHIELD

## 2015-07-18 ENCOUNTER — Ambulatory Visit: Payer: BLUE CROSS/BLUE SHIELD

## 2015-07-18 DIAGNOSIS — E291 Testicular hypofunction: Secondary | ICD-10-CM | POA: Diagnosis not present

## 2015-07-18 MED ORDER — TESTOSTERONE CYPIONATE 200 MG/ML IM SOLN
200.0000 mg | Freq: Once | INTRAMUSCULAR | Status: AC
  Administered 2015-07-18: 200 mg via INTRAMUSCULAR

## 2015-07-18 NOTE — Progress Notes (Signed)
Testosterone IM Injection  Due to Hypogonadism patient is present today for a Testosterone Injection.  Medication: Testosterone Cypionate Dose:100 Location: left upper outer buttocks Lot: [id number] Exp:11/2016  Patient tolerated well, no complications were noted  Preformed by: K.Russell,CMA

## 2015-07-26 ENCOUNTER — Ambulatory Visit (INDEPENDENT_AMBULATORY_CARE_PROVIDER_SITE_OTHER): Payer: BLUE CROSS/BLUE SHIELD

## 2015-07-26 DIAGNOSIS — E291 Testicular hypofunction: Secondary | ICD-10-CM

## 2015-07-26 MED ORDER — TESTOSTERONE CYPIONATE 200 MG/ML IM SOLN
200.0000 mg | Freq: Once | INTRAMUSCULAR | Status: AC
  Administered 2015-07-26: 200 mg via INTRAMUSCULAR

## 2015-07-26 NOTE — Progress Notes (Signed)
Testosterone IM Injection  Due to Hypogonadism patient is present today for a Testosterone Injection.  Medication: Testosterone Cypionate Dose: 0.68mL Location: right upper outer buttocks Lot: [id number] Exp:11/2016  Patient tolerated well, no complications were noted  Preformed by: Toniann Fail, LPN   Follow up: pt will be out of stated when next injection is due. Therefore he will get injection as soon as he gets back.

## 2015-08-12 ENCOUNTER — Ambulatory Visit (INDEPENDENT_AMBULATORY_CARE_PROVIDER_SITE_OTHER): Payer: BLUE CROSS/BLUE SHIELD

## 2015-08-12 ENCOUNTER — Ambulatory Visit: Payer: BLUE CROSS/BLUE SHIELD

## 2015-08-12 DIAGNOSIS — E291 Testicular hypofunction: Secondary | ICD-10-CM | POA: Diagnosis not present

## 2015-08-12 MED ORDER — TESTOSTERONE CYPIONATE 200 MG/ML IM SOLN
200.0000 mg | Freq: Once | INTRAMUSCULAR | Status: AC
  Administered 2015-08-12: 200 mg via INTRAMUSCULAR

## 2015-08-12 NOTE — Progress Notes (Signed)
Testosterone IM Injection  Due to Hypogonadism patient is present today for a Testosterone Injection.  Medication: Testosterone Cypionate Dose: 0.69mL Location: right upper outer buttocks Lot: [id number] Exp:11/2016  Patient tolerated well, no complications were noted  Preformed by: Toniann Fail, LPN

## 2015-08-22 ENCOUNTER — Ambulatory Visit: Payer: BLUE CROSS/BLUE SHIELD

## 2015-08-29 ENCOUNTER — Ambulatory Visit (INDEPENDENT_AMBULATORY_CARE_PROVIDER_SITE_OTHER): Payer: BLUE CROSS/BLUE SHIELD | Admitting: Urology

## 2015-08-29 ENCOUNTER — Encounter: Payer: Self-pay | Admitting: Urology

## 2015-08-29 VITALS — BP 118/76 | HR 103 | Ht 70.0 in | Wt 183.5 lb

## 2015-08-29 DIAGNOSIS — N529 Male erectile dysfunction, unspecified: Secondary | ICD-10-CM | POA: Diagnosis not present

## 2015-08-29 DIAGNOSIS — E291 Testicular hypofunction: Secondary | ICD-10-CM | POA: Diagnosis not present

## 2015-08-29 DIAGNOSIS — N398 Other specified disorders of urinary system: Secondary | ICD-10-CM | POA: Diagnosis not present

## 2015-08-29 MED ORDER — TAMSULOSIN HCL 0.4 MG PO CAPS: 0.4000 mg | ORAL_CAPSULE | Freq: Every day | ORAL | 11 refills | Status: DC

## 2015-08-29 MED ORDER — TESTOSTERONE CYPIONATE 200 MG/ML IM SOLN
200.0000 mg | Freq: Once | INTRAMUSCULAR | Status: AC
  Administered 2015-08-29: 200 mg via INTRAMUSCULAR

## 2015-08-29 NOTE — Progress Notes (Signed)
08/29/2015 11:59 AM   Carl Solis 1985-10-03 [id number]  Referring provider: Idelle Crouch, MD Carl Solis Carl Solis [id number]  Chief Complaint  Patient presents with  . Follow-up    hypogonadism    HPI: The patient is a 30 year old gentleman with a history of avascular necrosis on chronic opioids and hypogonadism presents today for follow-up. He was started on testosterone therapy at his last visit 3 months ago. At that time his testosterone was 287.    Since starting testosterone therapy, his energy is improved. He has noticed an increase his muscle mass. Is able to achieve and maintain erections sufficient for intercourse without difficulty. He is very happy with this treatment.  He does have issues with voiding since he is unable to stand and void due to his avascular necrosis. He notes that this has been going on since his catheter about a year ago. He denies spraying of his stream. He has to strain to urinate.  Also note, patient has chronic migraines since age of 74. He is homosexual and has no interest in conceiving a child with his sperm due to his severe hereditary migraines which he would not want to pass on to a child. He has thought about this a lot and confident in his decision.  The patient goes by "[name]."   PMH: Past Medical History:  Diagnosis Date  . Avascular necrosis ([place])   . Migraine   . Migraines     Surgical History: Past Surgical History:  Procedure Laterality Date  . ADENOIDECTOMY    . HIP SURGERY Left     Home Medications:    Medication List       Accurate as of 08/29/15 11:59 AM. Always use your most recent med list.          Butalbital-APAP-Caffeine 50-300-40 MG Caps   butorphanol 10 MG/ML nasal spray Commonly known as:  STADOL   citalopram 40 MG tablet Commonly known as:  CELEXA   diazepam 5 MG tablet Commonly known as:  VALIUM   fluticasone 50 MCG/ACT nasal spray Commonly known as:   FLONASE   LYRICA 150 MG capsule Generic drug:  pregabalin TK ONE C PO TID   ondansetron 4 MG disintegrating tablet Commonly known as:  ZOFRAN-ODT DIS 1 T ON THE TONGUE Q 8 H PRN   oxycodone-acetaminophen 10-300 MG tablet Commonly known as:  LYNOX Take 1 tablet by mouth every 4 (four) hours as needed for pain. Reported on 05/30/2015   OXYCONTIN 40 mg 12 hr tablet Generic drug:  oxyCODONE Take by mouth.   Oxycodone HCl 10 MG Tabs   pantoprazole 40 MG tablet Commonly known as:  PROTONIX Take by mouth.   promethazine 12.5 MG tablet Commonly known as:  PHENERGAN Take by mouth.   tamsulosin 0.4 MG Caps capsule Commonly known as:  FLOMAX Reported on 05/30/2015   tamsulosin 0.4 MG Caps capsule Commonly known as:  FLOMAX Take 1 capsule (0.4 mg total) by mouth daily.   testosterone cypionate 200 MG/ML injection Commonly known as:  DEPOTESTOSTERONE CYPIONATE Inject 0.38mL every 10 days   tiZANidine 4 MG capsule Commonly known as:  ZANAFLEX Take by mouth.       Allergies:  Allergies  Allergen Reactions  . D.H.E. [Dihydroergotamine] Swelling and Anaphylaxis    Lack Jaw, Rash, Hives  . Latex Hives and Rash  . Amoxicillin Diarrhea  . Dhea     Other reaction(s): Other (See Comments) "retain water in legs  which prevents me from walking"  . Eletriptan     Other reaction(s): Other (See Comments) "lock jaw where I can't eat or drink"  . Pork-Derived Products     Other reaction(s): Headache  . Rizatriptan     Other reaction(s): Other (See Comments) Lock jaw, rash, hives  . Sulfa Antibiotics Diarrhea  . Sulfacetamide Sodium Diarrhea  . Sumatriptan Other (See Comments)    "lock jaw where I can't eat or drink"  . Toradol [Ketorolac Tromethamine]   . Triptans   . Aspartame And Phenylalanine     headaches  . Bacitracin-Neomycin-Polymyxin Hives and Rash  . Ketorolac Hives and Rash  . Neosporin [Neomycin-Bacitracin Zn-Polymyx] Rash    Family History: Family History    Problem Relation Age of Onset  . Brain cancer Father   . Depression Mother   . Anxiety disorder Mother     Social History:  reports that he has been smoking.  He has a 5.00 pack-year smoking history. He has never used smokeless tobacco. He reports that he drinks alcohol. He reports that he does not use drugs.  ROS: UROLOGY Frequent Urination?: No Hard to postpone urination?: No Burning/pain with urination?: No Get up at night to urinate?: No Leakage of urine?: No Urine stream starts and stops?: No Trouble starting stream?: Yes Do you have to strain to urinate?: Yes Blood in urine?: No Urinary tract infection?: No Sexually transmitted disease?: No Injury to kidneys or bladder?: No Painful intercourse?: No Weak stream?: No Erection problems?: No Penile pain?: No  Gastrointestinal Nausea?: No Vomiting?: No Indigestion/heartburn?: No Diarrhea?: No Constipation?: No  Constitutional Fever: No Night sweats?: No Weight loss?: No Fatigue?: No  Skin Skin rash/lesions?: No Itching?: No  Eyes Blurred vision?: No Double vision?: No  Ears/Nose/Throat Sore throat?: No Sinus problems?: No  Hematologic/Lymphatic Swollen glands?: No Easy bruising?: No  Cardiovascular Leg swelling?: No Chest pain?: No  Respiratory Cough?: No Shortness of breath?: No  Endocrine Excessive thirst?: No  Musculoskeletal Back pain?: No Joint pain?: No  Neurological Headaches?: Yes Dizziness?: Yes  Psychologic Depression?: No Anxiety?: Yes  Physical Exam: BP 118/76 (BP Location: Left Arm, Patient Position: Sitting, Cuff Size: Large)   Pulse (!) 103   Ht 5\' 10"  (A999333 m)   Wt 183 lb 8 oz (83.2 kg)   BMI 26.33 kg/m   Constitutional:  Alert and oriented, No acute distress. HEENT: [place] AT, moist mucus membranes.  Trachea midline, no masses. Cardiovascular: No clubbing, cyanosis, or edema. Respiratory: Normal respiratory effort, no increased work of breathing. GI: Abdomen is  soft, nontender, nondistended, no abdominal masses GU: No CVA tenderness.  Skin: No rashes, bruises or suspicious lesions. Lymph: No cervical or inguinal adenopathy. Neurologic: Grossly intact, no focal deficits, moving all 4 extremities. Psychiatric: Normal mood and affect.  Laboratory Data: Lab Results  Component Value Date   WBC 16.2 (H) 11/07/2013   HGB 16.3 11/07/2013   HCT 40.2 07/03/2015   MCV 88 11/07/2013   PLT 196 11/07/2013    Lab Results  Component Value Date   CREATININE 1.23 11/07/2013    No results found for: PSA  Lab Results  Component Value Date   TESTOSTERONE 320 (L) 07/03/2015    No results found for: HGBA1C  Urinalysis    Component Value Date/Time   COLORURINE Colorless 11/07/2013 1437   APPEARANCEUR Clear 11/07/2013 1437   LABSPEC 1.001 11/07/2013 1437   PHURINE 8.0 11/07/2013 1437   GLUCOSEU Negative 11/07/2013 1437   HGBUR Negative 11/07/2013  [place] Negative 11/07/2013 1437   KETONESUR Negative 11/07/2013 1437   PROTEINUR Negative 11/07/2013 1437   NITRITE Negative 11/07/2013 1437   LEUKOCYTESUR Negative 11/07/2013 1437      Assessment & Plan:    1. Erectile Dysfunction 2. Hypogonadotropic Hypogonadism -continue testosterone cypionate 100 mg q 10 dys -due for testosterone, HCT check today  3. Dysfunctional voiding -may be 2/2 chronic opiods -will try flomax 0.4 mg dialy  Return in about 3 months (around 11/29/2015).  Nickie Retort, MD  [facility] [address] [place], [place] [address] [phone]

## 2015-08-29 NOTE — Progress Notes (Signed)
Testosterone IM Injection  Due to Hypogonadism patient is present today for a Testosterone Injection.  Medication: Testosterone Cypionate Dose: 0.45mL Location: left upper outer buttocks Lot: [id number] Exp:11/2016  Patient tolerated well, no complications were noted  Preformed by: Toniann Fail, LPN

## 2015-08-30 LAB — TESTOSTERONE: Testosterone: 219 ng/dL — ABNORMAL LOW (ref 264–916)

## 2015-08-30 LAB — HEMATOCRIT: Hematocrit: 46.7 % (ref 37.5–51.0)

## 2015-08-30 LAB — PSA: Prostate Specific Ag, Serum: 0.8 ng/mL (ref 0.0–4.0)

## 2015-09-06 ENCOUNTER — Telehealth: Payer: Self-pay

## 2015-09-06 NOTE — Telephone Encounter (Signed)
Can you let patient know his testosterone is a little low still. Can you have him come in halfway between injections to repeat his testosterone level? I want to make sure he is at a therapeutic level? thanks    Spoke with pt in reference to lab results. Pt stated that he is not able to come in for injections and has not heard from anyone in reference to injecting himself. Pt stated that he does not know when he will receive his next injection but will make appt there after.

## 2015-09-09 ENCOUNTER — Ambulatory Visit (INDEPENDENT_AMBULATORY_CARE_PROVIDER_SITE_OTHER): Payer: BLUE CROSS/BLUE SHIELD

## 2015-09-09 DIAGNOSIS — E291 Testicular hypofunction: Secondary | ICD-10-CM | POA: Diagnosis not present

## 2015-09-09 MED ORDER — TESTOSTERONE CYPIONATE 200 MG/ML IM SOLN
200.0000 mg | Freq: Once | INTRAMUSCULAR | Status: AC
  Administered 2015-09-09: 200 mg via INTRAMUSCULAR

## 2015-09-09 NOTE — Progress Notes (Signed)
Testosterone IM Injection  Due to Hypogonadism patient is present today for a Testosterone Injection.  Medication: Testosterone Cypionate Dose: 0.67mL Location: right upper outer buttocks Lot: [id number] Exp:11/2016  Patient tolerated well, no complications were noted  Preformed by: Toniann Fail, LPN  Follow up: Pt wants to transfer care to [facility] due to payments of injections.

## 2015-11-29 ENCOUNTER — Ambulatory Visit: Payer: BLUE CROSS/BLUE SHIELD

## 2015-12-13 ENCOUNTER — Ambulatory Visit: Payer: BLUE CROSS/BLUE SHIELD

## 2016-04-26 ENCOUNTER — Emergency Department
Admission: EM | Admit: 2016-04-26 | Discharge: 2016-04-26 | Disposition: A | Discharge status: Home / Self Care | Payer: BLUE CROSS/BLUE SHIELD | Attending: Emergency Medicine | Admitting: Emergency Medicine

## 2016-04-26 ENCOUNTER — Encounter: Payer: Self-pay | Admitting: Emergency Medicine

## 2016-04-26 DIAGNOSIS — Z79899 Other long term (current) drug therapy: Secondary | ICD-10-CM | POA: Insufficient documentation

## 2016-04-26 DIAGNOSIS — Z91040 Latex allergy status: Secondary | ICD-10-CM | POA: Insufficient documentation

## 2016-04-26 DIAGNOSIS — R51 Headache: Secondary | ICD-10-CM | POA: Diagnosis present

## 2016-04-26 DIAGNOSIS — F17200 Nicotine dependence, unspecified, uncomplicated: Secondary | ICD-10-CM | POA: Insufficient documentation

## 2016-04-26 DIAGNOSIS — G43111 Migraine with aura, intractable, with status migrainosus: Secondary | ICD-10-CM | POA: Insufficient documentation

## 2016-04-26 MED ORDER — DIPHENHYDRAMINE HCL 50 MG/ML IJ SOLN
25.0000 mg | Freq: Once | INTRAMUSCULAR | Status: AC
  Administered 2016-04-26: 25 mg via INTRAVENOUS
  Filled 2016-04-26: qty 1

## 2016-04-26 MED ORDER — HYDROMORPHONE HCL 1 MG/ML IJ SOLN
0.5000 mg | Freq: Once | INTRAMUSCULAR | Status: AC
  Administered 2016-04-26: 0.5 mg via INTRAVENOUS
  Filled 2016-04-26: qty 1

## 2016-04-26 MED ORDER — SODIUM CHLORIDE 0.9 % IV BOLUS (SEPSIS)
1000.0000 mL | Freq: Once | INTRAVENOUS | Status: AC
  Administered 2016-04-26: 1000 mL via INTRAVENOUS

## 2016-04-26 MED ORDER — PROCHLORPERAZINE EDISYLATE 5 MG/ML IJ SOLN
5.0000 mg | Freq: Once | INTRAMUSCULAR | Status: AC
  Administered 2016-04-26: 5 mg via INTRAVENOUS
  Filled 2016-04-26: qty 2

## 2016-04-26 MED ORDER — DEXAMETHASONE SODIUM PHOSPHATE 10 MG/ML IJ SOLN
10.0000 mg | Freq: Once | INTRAMUSCULAR | Status: AC
  Administered 2016-04-26: 10 mg via INTRAVENOUS
  Filled 2016-04-26: qty 1

## 2016-04-26 NOTE — ED Triage Notes (Signed)
Pt c/o migraine for 1 week with light/sounds/smell sensitivity and nausea. Hx frequent migraines.

## 2016-04-26 NOTE — Discharge Instructions (Signed)
Please return to the emergency department if you develop severe pain, fever, changes in vision or speech, changes in mental status, numbness tingling or weakness, or any other symptoms concerning to you.

## 2016-04-26 NOTE — ED Notes (Signed)

## 2016-04-26 NOTE — ED Notes (Signed)
Pt. States HA for the past week.  Pt. States long hx of migraines.  Pt. States current medications given no relief to symptoms.

## 2016-04-26 NOTE — ED Provider Notes (Signed)
[facility] Emergency Department Provider Note  ____________________________________________  Time seen: Approximately 7:42 PM  I have reviewed the triage vital signs and the nursing notes.   HISTORY  Chief Complaint Migraine    HPI Carl Solis is a 31 y.o. 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  The patient reports thathe has a daily headache which is 4 out of 10. For the past week, has had a progressively worsening headache, typical of his migraine. It is associated with photosensitivity, photophobia, and an aura of odors. He has had associated nausea and vomiting. He has not had any trauma, fevers, and the headache has no difference in severity or character from previous headaches. The patient was recently treated with steroids and azithromycin for sinus infection; his symptoms have improved except for right ear pain. He is followed by a neurologist in [place] for his migraines.   Past Medical History:  Diagnosis Date  . Avascular necrosis ([place])   . Migraine   . Migraines     Patient Active Problem List   Diagnosis Date Noted  . Avascular necrosis of bone ([place]) 02/26/2015  . Anxiety disorder 07/25/2014  . Headache, migraine 03/13/2014  . Cephalalgia 09/05/2013  . Disordered sleep 09/05/2013  . Current tobacco use 09/05/2013    Past Surgical History:  Procedure Laterality Date  . ADENOIDECTOMY    . HIP SURGERY Left     Current Outpatient Rx  . Order #: [id number] Class: Historical Med  . Order #: [id number] Class: Historical Med  . Order #: [id number] Class: Historical Med  . Order #: [id number] Class: Historical Med  . Order #: [id number] Class: Historical Med  . Order #: [id number] Class: Historical Med  . Order #: [id number] Class: Historical Med  . Order #: [id number] Class: Historical Med  . Order #:  [id number] Class: Historical Med  . Order #: [id number] Class: Historical Med  . Order #: [id number] Class: Historical Med  . Order #: [id number] Class: Historical Med  . Order #: [id number] Class: Historical Med  . Order #: [id number] Class: Normal  . Order #: [id number] Class: Print  . Order #: [id number] Class: Historical Med    Allergies D.h.e. [dihydroergotamine]; Latex; Amoxicillin; Dhea; Eletriptan; Pork-derived products; Rizatriptan; Sulfa antibiotics; Sulfacetamide sodium; Sumatriptan; Toradol [ketorolac tromethamine]; Triptans; Aspartame and phenylalanine; Bacitracin-neomycin-polymyxin; Ketorolac; and Neosporin [neomycin-bacitracin zn-polymyx]  Family History  Problem Relation Age of Onset  . Brain cancer Father   . Depression Mother   . Anxiety disorder Mother     Social History Social History  Substance Use Topics  . Smoking status: Current Every Day Smoker    Packs/day: 0.50    Years: 10.00  . Smokeless tobacco: Never Used  . Alcohol use Yes    Review of Systems Constitutional: No fever/chills.No lightheadedness, dizziness or syncope. Eyes: No visual changes. No blurred or double vision. Positive photosensitivity. ENT: No sore throat. No congestion or rhinorrhea.  Recently treated for sinus infection.  R ear pain. Cardiovascular: Denies chest pain. Denies palpitations. Respiratory: Denies shortness of breath.  No cough. Gastrointestinal: No abdominal pain.  No nausea, no vomiting.  No diarrhea.  No constipation. Genitourinary: Negative for dysuria. Musculoskeletal: Negative for back pain. Skin: Negative for rash. Neurological: Positive for typical migraines.. No focal numbness, tingling or weakness.   10-point ROS otherwise negative.  ____________________________________________   PHYSICAL EXAM:  VITAL SIGNS: ED Triage Vitals  Enc Vitals Group     BP 04/26/16 1735 132/76  Pulse Rate 04/26/16 1735 (!) 106     Resp 04/26/16 1735 18     Temp 04/26/16 1735 98.5 F  (36.9 C)     Temp Source 04/26/16 1735 Oral     SpO2 04/26/16 1735 98 %     Weight 04/26/16 1736 196 lb (88.9 kg)     Height 04/26/16 1736 5\' 10"  (1.778 m)     Head Circumference --      Peak Flow --      Pain Score 04/26/16 1745 10     Pain Loc --      Pain Edu? --      Excl. in [place]? --     Constitutional: Alert and oriented. Chronically ill appearing but in no acute distress. Answers questions appropriately. Eyes: Conjunctivae are normal.  EOMI. PERRLA. No scleral icterus. Head: Atraumatic. Nose: No congestion/rhinnorhea. Mouth/Throat: Mucous membranes are moist.  Neck: No stridor.  Supple.  No JVD. No meningismus Cardiovascular: Normal rate, regular rhythm. No murmurs, rubs or gallops.  Respiratory: Normal respiratory effort.  No accessory muscle use or retractions. Lungs CTAB.  No wheezes, rales or ronchi. Gastrointestinal: Soft, nontender and nondistended.  No guarding or rebound.  No peritoneal signs. Musculoskeletal: No LE edema. No ttp in the calves or palpable cords.  Negative Homan's sign. Neurologic:  A&Ox3.  Speech is clear.  Face and smile are symmetric.  EOMI. PERRLA.  Moves all extremities well. Skin:  Skin is warm, dry and intact. No rash noted. Psychiatric: Mood and affect are normal. Speech and behavior are normal.  Normal judgement.  ____________________________________________   LABS (all labs ordered are listed, but only abnormal results are displayed)  Labs Reviewed - No data to display ____________________________________________  EKG  Not indicated ____________________________________________  RADIOLOGY  No results found.  ____________________________________________   PROCEDURES  Procedure(s) performed: None  Procedures  Critical Care performed: No ____________________________________________   INITIAL IMPRESSION / ASSESSMENT AND PLAN / ED COURSE  Pertinent labs & imaging results that were available during my care of the patient were  reviewed by me and considered in my medical decision making (see chart for details).  31 y.o. L with a history of severe daily headaches presenting with a typical migraine or the patient has no red flag warnings. It is unlikely that he has a new intracranial process such as bleeding or mass; I do not see any evidence of meningitis today. We'll plan to treat him with the medication she states have worked for him in the past and reevaluate him for final disposition.  ----------------------------------------- 10:06 PM on 04/26/2016 -----------------------------------------  The patient's headache is now 4 out of 10, which is his baseline headache pain. We'll plan discharge. He understands follow-up instructions as well as return precautions.  ____________________________________________  FINAL CLINICAL IMPRESSION(S) / ED DIAGNOSES  Final diagnoses:  Intractable migraine with aura with status migrainosus         NEW MEDICATIONS STARTED DURING THIS VISIT:  New Prescriptions   No medications on file      Eula Listen, MD 04/26/16 2206

## 2016-04-26 NOTE — ED Notes (Signed)
Pt. States pain to right ear.  Pt. States finishing z-pac and steriods for rt. Ear infection.  Pt. States continued pain to rt. Ear.

## 2016-04-26 NOTE — ED Notes (Signed)
Pt. Also states pain to rt. Shoulder, chronic related.  Pt. States he does not qualify for surgery.

## 2016-05-01 IMAGING — MR MR HIP BILAT W/O CM
4 of 6 series · 23 of 40 positions shown · non-contrast
Comparison: CT abdomen and pelvis 10/18/2013.

CLINICAL DATA: Bilateral hip pain for 2 months.

EXAM:
MR OF THE BILATERAL HIPS WITHOUT CONTRAST
TECHNIQUE: Multiplanar, multisequence MR imaging was performed. No intravenous
contrast was administered.

[Series 2: T1 · axial · 5.0mm · 0.78mm/px · z∈[-128,+51]mm · 4 of 38 slices shown]
[im 1/38]
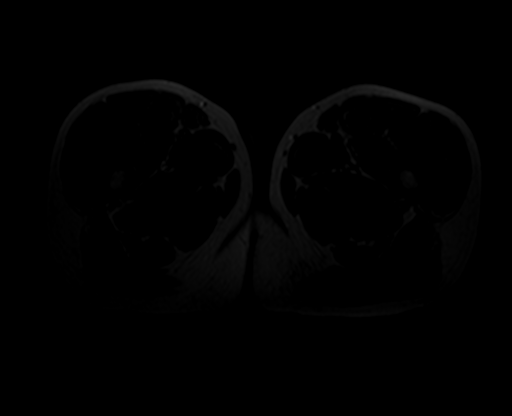
[im 7/38]
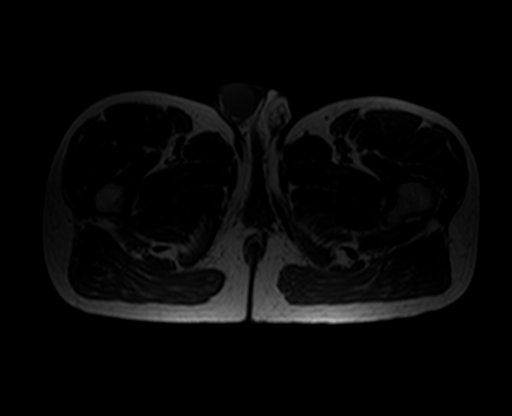
[im 19/38]
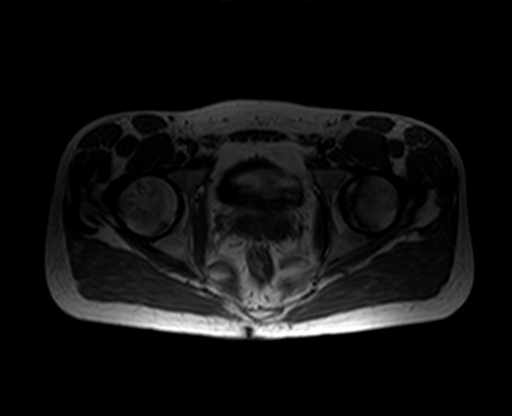
[im 31/38]
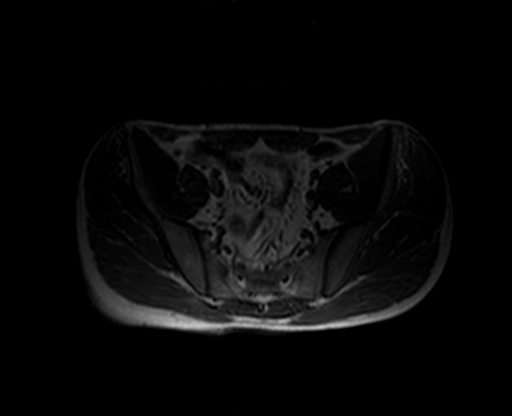

[Series 3: T2 fat-sat · axial · 5.0mm · 0.78mm/px · z∈[-130,+92]mm · 7 of 38 slices shown]
[im 1/38]
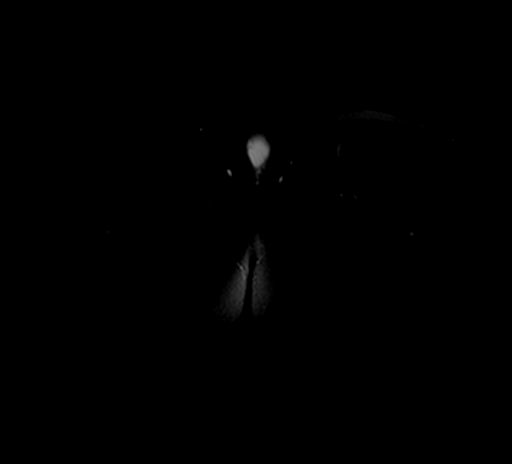
[im 7/38]
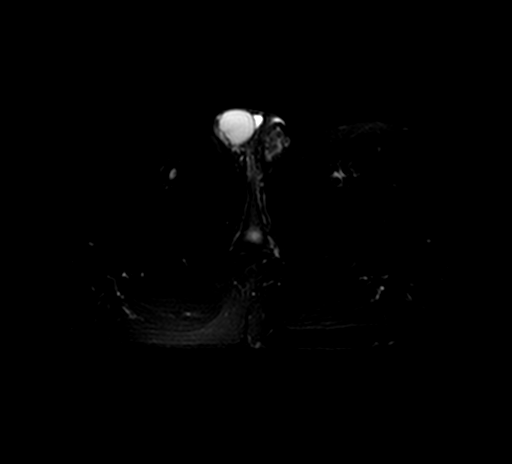
[im 13/38]
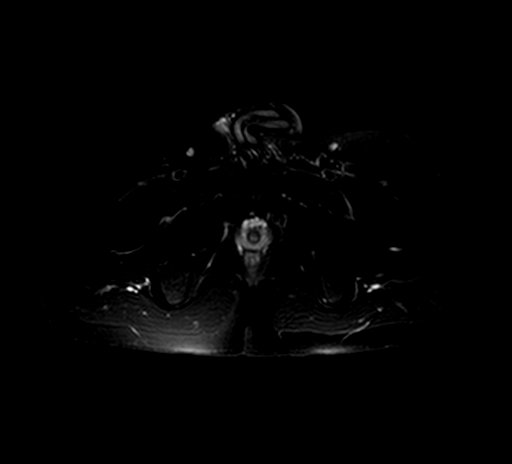
[im 19/38]
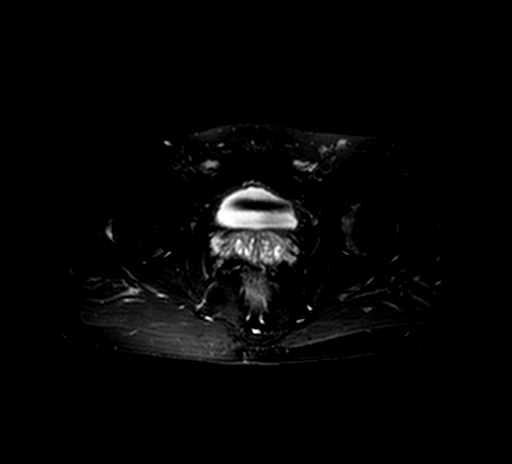
[im 25/38]
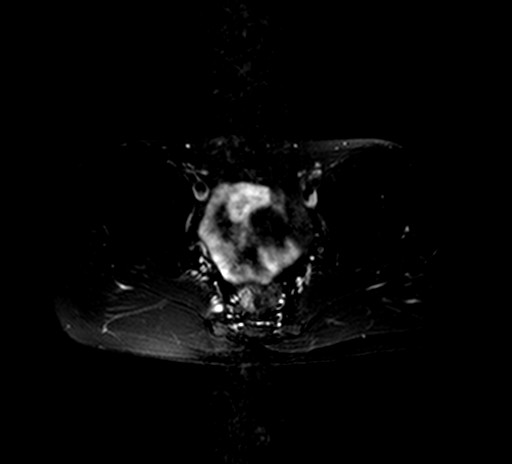
[im 31/38]
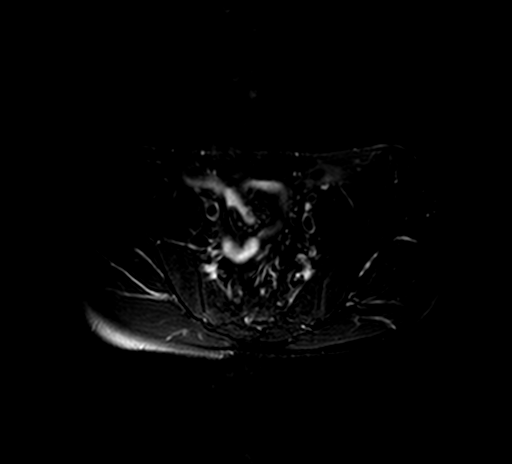
[im 38/38]
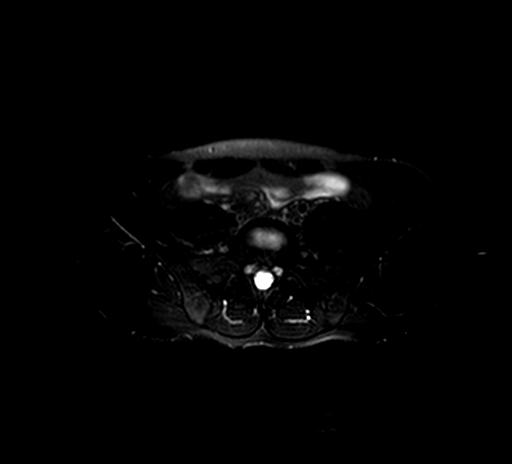

[Series 6: PD fat-sat · sagittal · 5.0mm · 0.35mm/px · 6 of 30 slices shown (1 of 2)]
[im 1/30]
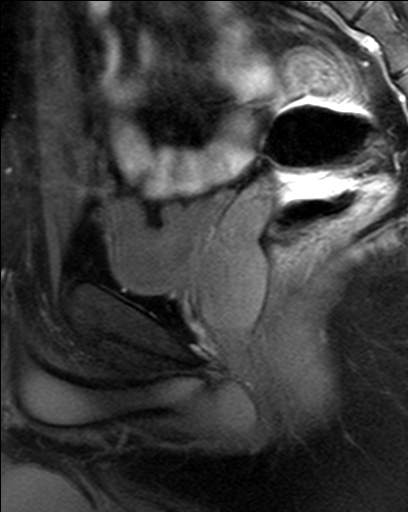
[im 6/30]
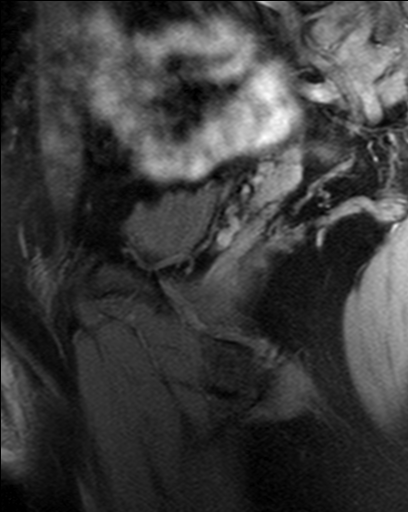
[im 12/30]
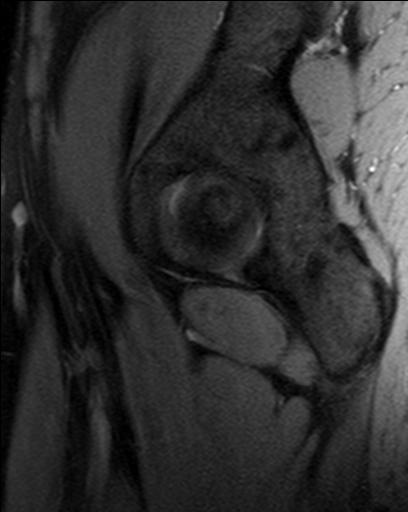
[im 18/30]
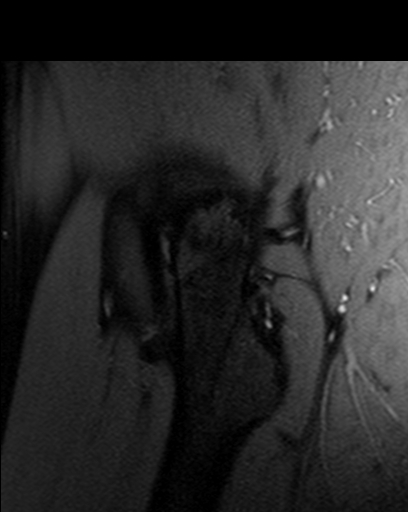
[im 24/30]
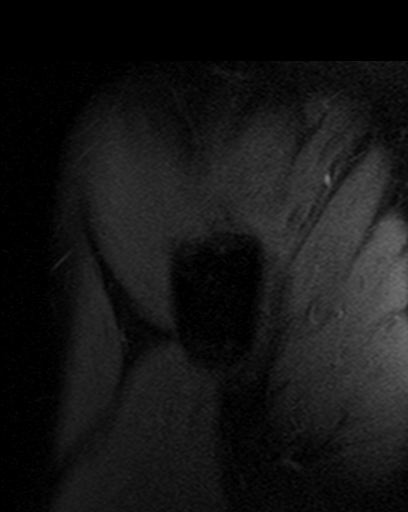
[im 30/30]
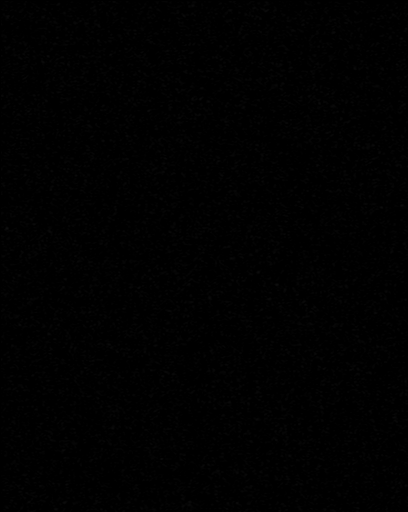

[Series 7: PD fat-sat · sagittal · 5.0mm · 0.35mm/px · 6 of 30 slices shown (2 of 2)]
[im 1/30]
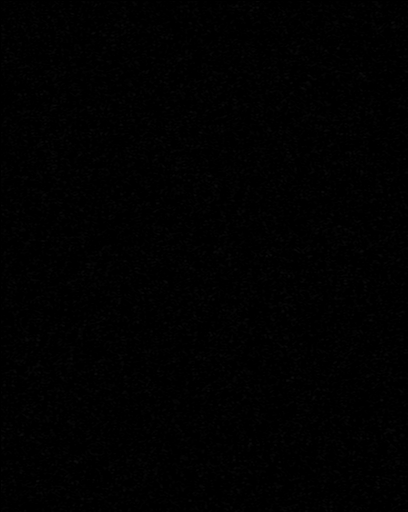
[im 6/30]
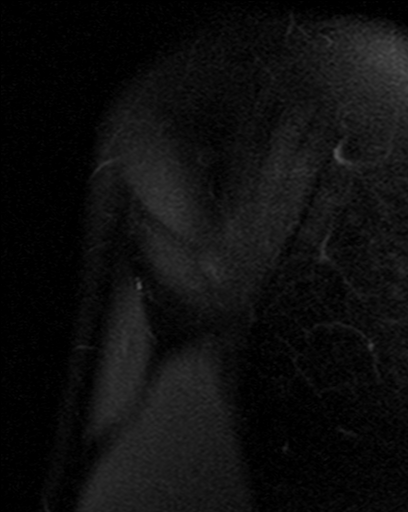
[im 12/30]
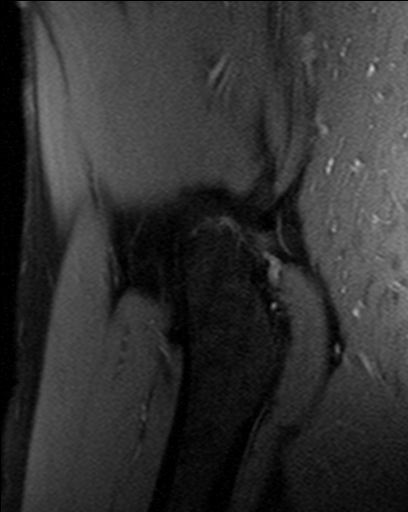
[im 18/30]
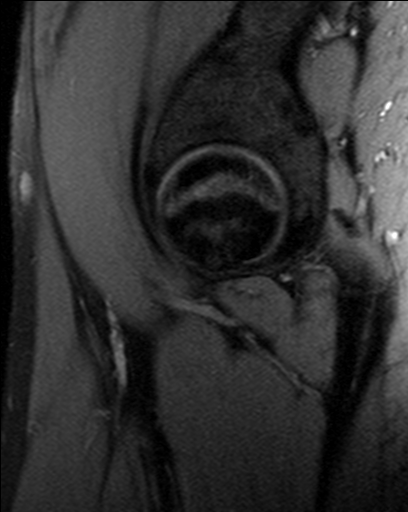
[im 24/30]
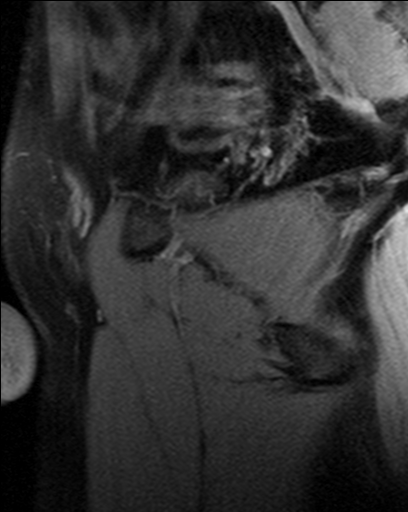
[im 30/30]
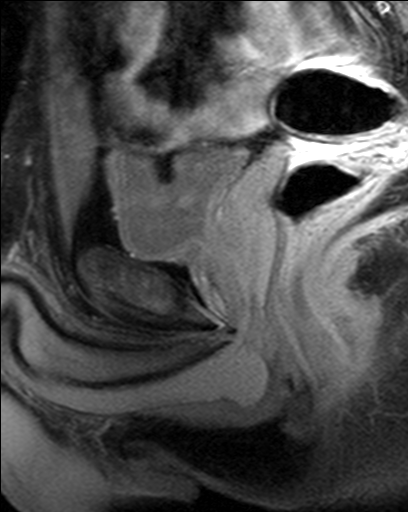

[23 of 40 positions shown; findings below may reference images not displayed]

FINDINGS: Bones: There is avascular necrosis of the femoral heads bilaterally
as seen on the prior CT scan. The bulk of the articular surface of
each femoral head appears involved, worse on the left. No
fragmentation or collapse is identified. There is no secondary
osteoarthritis. No joint effusion is seen. Bone marrow signal is
normal in the proximal femurs, throughout the pelvis and in the
lower lumbar spine.

Articular cartilage and labrum

Articular cartilage:  Appear normal bilaterally.

Labrum:  Intact.

Joint or bursal effusion

Joint effusion:  None.

Bursae:  Unremarkable.

Muscles and tendons

Muscles and tendons:  Intact and normal in appearance.

Other findings

Miscellaneous:  Imaged intrapelvic contents appear normal.
IMPRESSION: Avascular necrosis of the femoral heads bilaterally. Extent of
involvement appears somewhat worse on the left. There is no
secondary osteoarthritis or femoral head fragmentation or collapse.

## 2016-06-09 ENCOUNTER — Emergency Department
Admission: EM | Admit: 2016-06-09 | Discharge: 2016-06-09 | Disposition: A | Discharge status: Home / Self Care | Payer: BLUE CROSS/BLUE SHIELD | Attending: Emergency Medicine | Admitting: Emergency Medicine

## 2016-06-09 ENCOUNTER — Emergency Department: Payer: BLUE CROSS/BLUE SHIELD

## 2016-06-09 DIAGNOSIS — K5900 Constipation, unspecified: Secondary | ICD-10-CM

## 2016-06-09 DIAGNOSIS — F17200 Nicotine dependence, unspecified, uncomplicated: Secondary | ICD-10-CM | POA: Diagnosis not present

## 2016-06-09 DIAGNOSIS — R339 Retention of urine, unspecified: Secondary | ICD-10-CM

## 2016-06-09 LAB — URINALYSIS, COMPLETE (UACMP) WITH MICROSCOPIC
BACTERIA UA: NONE SEEN
Bilirubin Urine: NEGATIVE
Glucose, UA: NEGATIVE mg/dL
Hgb urine dipstick: NEGATIVE
KETONES UR: NEGATIVE mg/dL
LEUKOCYTES UA: NEGATIVE
Nitrite: NEGATIVE
PH: 7 (ref 5.0–8.0)
PROTEIN: NEGATIVE mg/dL
SQUAMOUS EPITHELIAL / LPF: NONE SEEN
Specific Gravity, Urine: 1.003 — ABNORMAL LOW (ref 1.005–1.030)

## 2016-06-09 MED ORDER — POLYETHYLENE GLYCOL 3350 17 G PO PACK: 17.0000 g | PACK | Freq: Every day | ORAL | 0 refills | Status: DC

## 2016-06-09 MED ORDER — MAGNESIUM CITRATE PO SOLN
1.0000 | Freq: Once | ORAL | Status: AC
  Administered 2016-06-09: 1 via ORAL
  Filled 2016-06-09: qty 296

## 2016-06-09 MED ORDER — DOCUSATE SODIUM 100 MG PO CAPS: 100.0000 mg | ORAL_CAPSULE | Freq: Two times a day (BID) | ORAL | 0 refills | Status: AC

## 2016-06-09 NOTE — ED Notes (Signed)
Pt resting in bed, family at bedside, pt given call bell, awake and alert in no acute distress

## 2016-06-09 NOTE — ED Triage Notes (Signed)
Pt states he has not moved his bowels normally since Monday, states also has hemorrhoids. Tried to self disimpact himself today and not is not able to void. Last voided was at 0100.

## 2016-06-09 NOTE — Discharge Instructions (Signed)
Please follow up with your primary care physician.

## 2016-06-09 NOTE — ED Notes (Signed)
Per Dr. Jimmye Norman pt okay to be discharged before urination, return precautions given

## 2016-06-09 NOTE — ED Notes (Signed)
Pt resting in bed, family at bedside, enema administered, pts bed by toilet, call bell given to pt

## 2016-06-09 NOTE — ED Notes (Signed)
Pt had BM, Dr. Dahlia Client aware, orders to d/c foley recieved

## 2016-06-09 NOTE — ED Notes (Signed)
Discussed possibility of enema (as previously discussed with MD). Patient states he wishes to attempt to have BM on his own "one or two more times". No other needs at this time.

## 2016-06-09 NOTE — ED Notes (Signed)
Foley removed, pt made aware of need for notification when he has to urinate, given water to drink

## 2016-06-09 NOTE — ED Provider Notes (Signed)
[facility] Emergency Department Provider Note   ____________________________________________   First MD Initiated Contact with Patient 06/09/16 [phone]     (approximate)  I have reviewed the triage vital signs and the nursing notes.   HISTORY  Chief Complaint Constipation    HPI Carl Solis is a 31 y.o. male who comes into the hospital today with constipation and urinary retention. The patient states he's been having problems pooping since about 12:30 PM. He states he's been trying but he's been unable to do so. He drinks 30 ounces of water and reports that now he is unable to urinate. His feces is hard. The patient reports that his last bowel movement was a week ago. He took a Dulcolax at 3 PM. He reports though that being unable to urinate is making his abdomen hurt. He reports that he feels as though he is about to explode. The patient is here today for treatment and evaluation of his urinary retention and has hemorrhoids. The patient rates his pain a 7 out of 10 in intensity.The patient has no nausea or vomiting.   Past Medical History:  Diagnosis Date  . Avascular necrosis ([place])   . Migraine   . Migraines     Patient Active Problem List   Diagnosis Date Noted  . Avascular necrosis of bone ([place]) 02/26/2015  . Anxiety disorder 07/25/2014  . Headache, migraine 03/13/2014  . Cephalalgia 09/05/2013  . Disordered sleep 09/05/2013  . Current tobacco use 09/05/2013    Past Surgical History:  Procedure Laterality Date  . ADENOIDECTOMY    . HIP SURGERY Left     Prior to Admission medications   Medication Sig Start Date End Date Taking? Authorizing Provider  Butalbital-APAP-Caffeine 50-300-40 MG CAPS  11/10/13   [provider]  butorphanol (STADOL) 10 MG/ML nasal spray  09/04/14   [provider]  citalopram (CELEXA) 40 MG tablet  03/29/15   [provider]  diazepam (VALIUM) 5 MG tablet  12/30/14   [provider]  docusate sodium (COLACE) 100 MG capsule Take 1 capsule (100 mg total) by mouth 2 (two) times daily. 06/09/16 06/09/17  Loney Hering, MD  fluticasone Asencion Islam) 50 MCG/ACT nasal spray  05/05/15   [provider]  LYRICA 150 MG capsule TK ONE C PO TID 04/17/15   [provider]  ondansetron (ZOFRAN-ODT) 4 MG disintegrating tablet DIS 1 T ON THE TONGUE Q 8 H PRN 04/18/15   [provider]  oxyCODONE (OXYCONTIN) 40 mg 12 hr tablet Take by mouth.    [provider]  Oxycodone HCl 10 MG TABS  05/10/15   [provider]  oxycodone-acetaminophen (LYNOX) 10-300 MG tablet Take 1 tablet by mouth every 4 (four) hours as needed for pain. Reported on 05/30/2015    [provider]  pantoprazole (PROTONIX) 40 MG tablet Take by mouth. 03/01/15 02/29/16  [provider]  polyethylene glycol (MIRALAX) packet Take 17 g by mouth daily. 06/09/16   Loney Hering, MD  promethazine (PHENERGAN) 12.5 MG tablet Take by mouth.    [provider]  tamsulosin (FLOMAX) 0.4 MG CAPS capsule Reported on 05/30/2015 12/07/13   [provider]  tamsulosin (FLOMAX) 0.4 MG CAPS capsule Take 1 capsule (0.4 mg total) by mouth daily. 08/29/15   Nickie Retort, MD  testosterone cypionate (DEPOTESTOSTERONE CYPIONATE) 200 MG/ML injection Inject 0.67mL every 10 days 07/05/15   Hollice Espy, MD  tiZANidine (ZANAFLEX) 4 MG capsule Take by mouth.  [provider]    Allergies D.h.e. [dihydroergotamine]; Latex; Amoxicillin; Dhea; Eletriptan; Pork-derived products; Rizatriptan; Sulfa antibiotics; Sulfacetamide sodium; Sumatriptan; Toradol [ketorolac tromethamine]; Triptans; Aspartame and phenylalanine; Bacitracin-neomycin-polymyxin; Ketorolac; and Neosporin [neomycin-bacitracin zn-polymyx]  Family History  Problem Relation Age of Onset  . Brain cancer Father   . Depression Mother   . Anxiety disorder Mother     Social History Social History    Substance Use Topics  . Smoking status: Current Every Day Smoker    Packs/day: 0.50    Years: 10.00  . Smokeless tobacco: Never Used  . Alcohol use Yes    Review of Systems  Constitutional: No fever/chills Eyes: No visual changes. ENT: No sore throat. Cardiovascular: Denies chest pain. Respiratory: Denies shortness of breath. Gastrointestinal:  abdominal pain with constipation and  No nausea, no vomiting.  Genitourinary: Urinary retention Musculoskeletal: Negative for back pain. Skin: Negative for rash. Neurological: Negative for headaches, focal weakness or numbness.   ____________________________________________   PHYSICAL EXAM:  VITAL SIGNS: ED Triage Vitals  Enc Vitals Group     BP 06/09/16 0437 (!) 156/79     Pulse Rate 06/09/16 0437 (!) 101     Resp 06/09/16 0437 18     Temp 06/09/16 0439 98.4 F (36.9 C)     Temp Source 06/09/16 0439 Oral     SpO2 06/09/16 0437 99 %     Weight 06/09/16 0438 193 lb (87.5 kg)     Height 06/09/16 0438 5\' 10"  (1.778 m)     Head Circumference --      Peak Flow --      Pain Score 06/09/16 0437 7     Pain Loc --      Pain Edu? --      Excl. in [place]? --     Constitutional: Alert and oriented. Well appearing and in moderate distress. Eyes: Conjunctivae are normal. PERRL. EOMI. Head: Atraumatic. Nose: No congestion/rhinnorhea. Mouth/Throat: Mucous membranes are moist.  Oropharynx non-erythematous. Cardiovascular: Normal rate, regular rhythm. Grossly normal heart sounds.  Good peripheral circulation. Respiratory: Normal respiratory effort.  No retractions. Lungs CTAB. Gastrointestinal: Soft with lower abd tenderness to palpation. No distention.  Musculoskeletal: No lower extremity tenderness nor edema.  Neurologic:  Normal speech and language.  Skin:  Skin is warm, dry and intact.  Psychiatric: Mood and affect are normal.   ____________________________________________   LABS (all labs ordered are listed, but only abnormal  results are displayed)  Labs Reviewed  URINALYSIS, COMPLETE (UACMP) WITH MICROSCOPIC - Abnormal; Notable for the following:       Result Value   Color, Urine YELLOW (*)    APPearance CLEAR (*)    Specific Gravity, Urine 1.003 (*)    All other components within normal limits   ____________________________________________  EKG  none ____________________________________________  RADIOLOGY  none ____________________________________________   PROCEDURES  Procedure(s) performed: None  Procedures  Critical Care performed: No  ____________________________________________   INITIAL IMPRESSION / ASSESSMENT AND PLAN / ED COURSE  Pertinent labs & imaging results that were available during my care of the patient were reviewed by me and considered in my medical decision making (see chart for details).  This is a 31 year old male who comes into the hospital today with constipation and urinary retention. We did place a Foley catheter in the patient and we'll check his urine. We'll also send the patient for a KUB and then consider an enema to help her have a bowel movement.  Clinical Course as of May 08  [place]  0825 Moderate to large stool burden.  Changes compatible with AVN in both hips and previous surgery in the left femur.   DG Abdomen 1 View [AW]    Clinical Course User Index [AW] Loney Hering, MD   The patient did receive an enema and had a bowel movement. We will remove the Foley catheter to ensure that he is able to urinate before he was discharged home.  ____________________________________________   FINAL CLINICAL IMPRESSION(S) / ED DIAGNOSES  Final diagnoses:  Constipation, unspecified constipation type  Urinary retention      NEW MEDICATIONS STARTED DURING THIS VISIT:  New Prescriptions   DOCUSATE SODIUM (COLACE) 100 MG CAPSULE    Take 1 capsule (100 mg total) by mouth 2 (two) times daily.   POLYETHYLENE GLYCOL (MIRALAX) PACKET     Take 17 g by mouth daily.     Note:  This document was prepared using Dragon voice recognition software and may include unintentional dictation errors.    Loney Hering, MD 06/09/16 [phone]

## 2016-10-26 ENCOUNTER — Emergency Department
Admission: EM | Admit: 2016-10-26 | Discharge: 2016-10-26 | Disposition: A | Discharge status: Home / Self Care | Payer: BLUE CROSS/BLUE SHIELD | Attending: Student in an Organized Health Care Education/Training Program | Admitting: Student in an Organized Health Care Education/Training Program

## 2016-10-26 ENCOUNTER — Encounter: Payer: Self-pay | Admitting: Emergency Medicine

## 2016-10-26 DIAGNOSIS — Z91040 Latex allergy status: Secondary | ICD-10-CM | POA: Diagnosis not present

## 2016-10-26 DIAGNOSIS — F17200 Nicotine dependence, unspecified, uncomplicated: Secondary | ICD-10-CM | POA: Diagnosis not present

## 2016-10-26 DIAGNOSIS — J0100 Acute maxillary sinusitis, unspecified: Secondary | ICD-10-CM | POA: Diagnosis not present

## 2016-10-26 DIAGNOSIS — Z79899 Other long term (current) drug therapy: Secondary | ICD-10-CM | POA: Diagnosis not present

## 2016-10-26 DIAGNOSIS — R51 Headache: Secondary | ICD-10-CM | POA: Diagnosis present

## 2016-10-26 MED ORDER — AMOXICILLIN-POT CLAVULANATE 875-125 MG PO TABS: 1.0000 | ORAL_TABLET | Freq: Two times a day (BID) | ORAL | 0 refills | Status: AC

## 2016-10-26 NOTE — ED Provider Notes (Signed)
[facility] Emergency Department Provider Note  ____________________________________________  Time seen: Approximately 4:31 PM  I have reviewed the triage vital signs and the nursing notes.   HISTORY  Chief Complaint Facial Pain    HPI Carl Solis is a 31 y.o. male with a history of chronic migraines presents to the emergency department with purulent rhinorrhea, maxillary and frontal sinus tenderness and malaise for the past 4 days. Patient states that he was prescribed doxycycline by his primary care provider due to an amoxicillin allergy. Patient states that amoxicillin gives him diarrhea. He denies chest pain, chest tightness, shortness of breath, nausea, vomiting and abdominal pain.   Past Medical History:  Diagnosis Date  . Avascular necrosis ([place])   . Migraine   . Migraines     Patient Active Problem List   Diagnosis Date Noted  . Avascular necrosis of bone ([place]) 02/26/2015  . Anxiety disorder 07/25/2014  . Headache, migraine 03/13/2014  . Cephalalgia 09/05/2013  . Disordered sleep 09/05/2013  . Current tobacco use 09/05/2013    Past Surgical History:  Procedure Laterality Date  . ADENOIDECTOMY    . HIP SURGERY Left     Prior to Admission medications   Medication Sig Start Date End Date Taking? Authorizing Provider  amoxicillin-clavulanate (AUGMENTIN) 875-125 MG tablet Take 1 tablet by mouth 2 (two) times daily. 10/26/16 11/05/16  Lannie Fields, PA-C  Butalbital-APAP-Caffeine 50-300-40 MG CAPS  11/10/13   [provider]  butorphanol (STADOL) 10 MG/ML nasal spray  09/04/14   [provider]  citalopram (CELEXA) 40 MG tablet  03/29/15   [provider]  diazepam (VALIUM) 5 MG tablet  12/30/14   [provider]  docusate sodium (COLACE) 100 MG capsule Take 1 capsule (100 mg total) by mouth 2 (two) times daily. 06/09/16 06/09/17  Loney Hering, MD  fluticasone Asencion Islam) 50 MCG/ACT nasal spray  05/05/15    [provider]  LYRICA 150 MG capsule TK ONE C PO TID 04/17/15   [provider]  ondansetron (ZOFRAN-ODT) 4 MG disintegrating tablet DIS 1 T ON THE TONGUE Q 8 H PRN 04/18/15   [provider]  oxyCODONE (OXYCONTIN) 40 mg 12 hr tablet Take by mouth.    [provider]  Oxycodone HCl 10 MG TABS  05/10/15   [provider]  oxycodone-acetaminophen (LYNOX) 10-300 MG tablet Take 1 tablet by mouth every 4 (four) hours as needed for pain. Reported on 05/30/2015    [provider]  pantoprazole (PROTONIX) 40 MG tablet Take by mouth. 03/01/15 02/29/16  [provider]  polyethylene glycol (MIRALAX) packet Take 17 g by mouth daily. 06/09/16   Loney Hering, MD  promethazine (PHENERGAN) 12.5 MG tablet Take by mouth.    [provider]  tamsulosin (FLOMAX) 0.4 MG CAPS capsule Reported on 05/30/2015 12/07/13   [provider]  tamsulosin (FLOMAX) 0.4 MG CAPS capsule Take 1 capsule (0.4 mg total) by mouth daily. 08/29/15   Nickie Retort, MD  testosterone cypionate (DEPOTESTOSTERONE CYPIONATE) 200 MG/ML injection Inject 0.37mL every 10 days 07/05/15   Hollice Espy, MD  tiZANidine (ZANAFLEX) 4 MG capsule Take by mouth.    [provider]    Allergies D.h.e. [dihydroergotamine]; Latex; Amoxicillin; Dhea; Eletriptan; Pork-derived products; Rizatriptan; Sulfa antibiotics; Sulfacetamide sodium; Sumatriptan; Toradol [ketorolac tromethamine]; Triptans; Aspartame and phenylalanine; Bacitracin-neomycin-polymyxin; Ketorolac; and Neosporin [neomycin-bacitracin zn-polymyx]  Family History  Problem Relation Age of Onset  . Brain cancer Father   . Depression Mother   .  Anxiety disorder Mother     Social History Social History  Substance Use Topics  . Smoking status: Current Every Day Smoker    Packs/day: 0.10    Years: 10.00  . Smokeless tobacco: Never Used  . Alcohol use Yes     Review of Systems  Constitutional: No  fever/chills. Patient has frontal and maxillary sinus tenderness. Eyes: No visual changes. No discharge ENT: Patient has purulent rhinorrhea.  Cardiovascular: no chest pain. Respiratory: no cough. No SOB. Gastrointestinal: No abdominal pain.  No nausea, no vomiting.  No diarrhea.  No constipation. Musculoskeletal: Negative for musculoskeletal pain. Skin: Negative for rash, abrasions, lacerations, ecchymosis. Neurological: Negative for headaches, focal weakness or numbness.   ____________________________________________   PHYSICAL EXAM:  VITAL SIGNS: ED Triage Vitals [10/26/16 1531]  Enc Vitals Group     BP (!) 145/91     Pulse Rate (!) 128     Resp 20     Temp 99.2 F (37.3 C)     Temp Source Oral     SpO2 100 %     Weight 193 lb (87.5 kg)     Height 5\' 10"  (1.778 m)     Head Circumference      Peak Flow      Pain Score 5     Pain Loc      Pain Edu?      Excl. in [place]?      Constitutional: Alert and oriented. Well appearing and in no acute distress. Eyes: Conjunctivae are normal. PERRL. EOMI. Head: Atraumatic. Patient has frontal and maxillary sinus tenderness.  ENT:      Ears: Tympanic membranes are pearly bilaterally.       Nose: No congestion/rhinnorhea.      Mouth/Throat: Mucous membranes are moist.   Cardiovascular: Normal rate, regular rhythm. Normal S1 and S2.  Good peripheral circulation. Respiratory: Normal respiratory effort without tachypnea or retractions. Lungs CTAB. Good air entry to the bases with no decreased or absent breath sounds. Skin:  Skin is warm, dry and intact. No rash noted. Psychiatric: Mood and affect are normal. Speech and behavior are normal. Patient exhibits appropriate insight and judgement.   ____________________________________________   LABS (all labs ordered are listed, but only abnormal results are displayed)  Labs Reviewed - No data to  display ____________________________________________  EKG   ____________________________________________  RADIOLOGY   No results found.  ____________________________________________    PROCEDURES  Procedure(s) performed:    Procedures    Medications - No data to display   ____________________________________________   INITIAL IMPRESSION / ASSESSMENT AND PLAN / ED COURSE  Pertinent labs & imaging results that were available during my care of the patient were reviewed by me and considered in my medical decision making (see chart for details).  Review of the [place] CSRS was performed in accordance of the [place] prior to dispensing any controlled drugs.     Assessment and Plan:  Sinusitis:  Patient presents to the emergency department with purulent rhinorrhea, frontal and maxillary sinus tenderness and malaise. History and physical exam findings are consistent with sinusitis. Patient was discharged with Augmentin as patient does not have true amoxicillin allergy. Patient was advised to follow-up with primary care as needed. All patient questions were answered.   ____________________________________________  FINAL CLINICAL IMPRESSION(S) / ED DIAGNOSES  Final diagnoses:  Acute maxillary sinusitis, recurrence not specified      NEW MEDICATIONS STARTED DURING THIS VISIT:  New Prescriptions   AMOXICILLIN-CLAVULANATE (AUGMENTIN) 875-125 MG TABLET  Take 1 tablet by mouth 2 (two) times daily.        This chart was dictated using voice recognition software/Dragon. Despite best efforts to proofread, errors can occur which can change the meaning. Any change was purely unintentional.    Lannie Fields, PA-C 10/26/16 1639    Merlyn Lot, MD 10/26/16 [phone]

## 2016-10-26 NOTE — ED Triage Notes (Signed)
States is having chills. Had migraine x 2 weeks, now is down to minimal. States has had sinus infection and is taking doxycycline x 3 days. States he is concerned he might be dehydrated but is taking po fluids well, states is taking more than usual amount, and has no nausea or vomiting. Continues green sinus drainage.

## 2016-10-26 NOTE — ED Notes (Signed)
See triage note  Presents with facial pain and sinus pressure  States he has been on meds for same   muscus membranes moist  States he is taking po fluids   Denies n/v

## 2017-04-16 ENCOUNTER — Other Ambulatory Visit: Payer: Self-pay | Admitting: Physician Assistant

## 2017-04-16 DIAGNOSIS — M5481 Occipital neuralgia: Secondary | ICD-10-CM

## 2017-04-16 DIAGNOSIS — G43719 Chronic migraine without aura, intractable, without status migrainosus: Secondary | ICD-10-CM

## 2017-04-28 ENCOUNTER — Other Ambulatory Visit: Payer: BLUE CROSS/BLUE SHIELD

## 2017-04-29 ENCOUNTER — Ambulatory Visit
Admission: RE | Admit: 2017-04-29 | Discharge: 2017-04-29 | Disposition: A | Discharge status: Home / Self Care | Payer: BLUE CROSS/BLUE SHIELD | Source: Ambulatory Visit | Attending: Physician Assistant | Admitting: Physician Assistant

## 2017-04-29 DIAGNOSIS — G930 Cerebral cysts: Secondary | ICD-10-CM | POA: Insufficient documentation

## 2017-04-29 DIAGNOSIS — G43719 Chronic migraine without aura, intractable, without status migrainosus: Secondary | ICD-10-CM | POA: Insufficient documentation

## 2017-04-29 DIAGNOSIS — M5481 Occipital neuralgia: Secondary | ICD-10-CM

## 2017-04-29 MED ORDER — GADOBENATE DIMEGLUMINE 529 MG/ML IV SOLN
20.0000 mL | Freq: Once | INTRAVENOUS | Status: AC | PRN
  Administered 2017-04-29: 18 mL via INTRAVENOUS

## 2017-10-19 ENCOUNTER — Other Ambulatory Visit: Payer: Self-pay | Admitting: Internal Medicine

## 2017-10-19 DIAGNOSIS — G930 Cerebral cysts: Secondary | ICD-10-CM

## 2017-11-04 ENCOUNTER — Ambulatory Visit
Admission: RE | Admit: 2017-11-04 | Discharge: 2017-11-04 | Disposition: A | Discharge status: Home / Self Care | Payer: BLUE CROSS/BLUE SHIELD | Source: Ambulatory Visit | Attending: Internal Medicine | Admitting: Internal Medicine

## 2017-11-04 DIAGNOSIS — G930 Cerebral cysts: Secondary | ICD-10-CM | POA: Insufficient documentation

## 2017-11-04 MED ORDER — GADOBENATE DIMEGLUMINE 529 MG/ML IV SOLN
15.0000 mL | Freq: Once | INTRAVENOUS | Status: AC | PRN
  Administered 2017-11-04: 15 mL via INTRAVENOUS

## 2017-11-22 ENCOUNTER — Encounter: Payer: Self-pay | Admitting: Emergency Medicine

## 2017-11-22 ENCOUNTER — Emergency Department
Admission: EM | Admit: 2017-11-22 | Discharge: 2017-11-22 | Disposition: A | Discharge status: Home / Self Care | Payer: BLUE CROSS/BLUE SHIELD | Attending: Emergency Medicine | Admitting: Emergency Medicine

## 2017-11-22 DIAGNOSIS — F17210 Nicotine dependence, cigarettes, uncomplicated: Secondary | ICD-10-CM | POA: Diagnosis not present

## 2017-11-22 DIAGNOSIS — E860 Dehydration: Secondary | ICD-10-CM

## 2017-11-22 DIAGNOSIS — R112 Nausea with vomiting, unspecified: Secondary | ICD-10-CM | POA: Diagnosis present

## 2017-11-22 DIAGNOSIS — Z79899 Other long term (current) drug therapy: Secondary | ICD-10-CM | POA: Diagnosis not present

## 2017-11-22 DIAGNOSIS — A084 Viral intestinal infection, unspecified: Secondary | ICD-10-CM

## 2017-11-22 DIAGNOSIS — J1189 Influenza due to unidentified influenza virus with other manifestations: Secondary | ICD-10-CM | POA: Diagnosis not present

## 2017-11-22 DIAGNOSIS — J111 Influenza due to unidentified influenza virus with other respiratory manifestations: Secondary | ICD-10-CM

## 2017-11-22 DIAGNOSIS — R69 Illness, unspecified: Secondary | ICD-10-CM

## 2017-11-22 LAB — URINALYSIS, COMPLETE (UACMP) WITH MICROSCOPIC
Bacteria, UA: NONE SEEN
Glucose, UA: NEGATIVE mg/dL
Hgb urine dipstick: NEGATIVE
Ketones, ur: 5 mg/dL — AB
Leukocytes, UA: NEGATIVE
NITRITE: NEGATIVE
PH: 5 (ref 5.0–8.0)
Protein, ur: 100 mg/dL — AB
SPECIFIC GRAVITY, URINE: 1.033 — AB (ref 1.005–1.030)

## 2017-11-22 LAB — COMPREHENSIVE METABOLIC PANEL
ALT: 49 U/L — ABNORMAL HIGH (ref 0–44)
ANION GAP: 10 (ref 5–15)
AST: 41 U/L (ref 15–41)
Albumin: 4.7 g/dL (ref 3.5–5.0)
Alkaline Phosphatase: 55 U/L (ref 38–126)
BUN: 9 mg/dL (ref 6–20)
CALCIUM: 9.8 mg/dL (ref 8.9–10.3)
CO2: 28 mmol/L (ref 22–32)
Chloride: 104 mmol/L (ref 98–111)
Creatinine, Ser: 1.29 mg/dL — ABNORMAL HIGH (ref 0.61–1.24)
GFR calc Af Amer: >60 mL/min (ref >60)
Glucose, Bld: 166 mg/dL — ABNORMAL HIGH (ref 70–99)
POTASSIUM: 3.7 mmol/L (ref 3.5–5.1)
Sodium: 142 mmol/L (ref 135–145)
Total Bilirubin: 1.9 mg/dL — ABNORMAL HIGH (ref 0.3–1.2)
Total Protein: 7.6 g/dL (ref 6.5–8.1)

## 2017-11-22 LAB — CBC
HCT: 44.4 % (ref 39.0–52.0)
HEMOGLOBIN: 15.3 g/dL (ref 13.0–17.0)
MCH: 27.6 pg (ref 26.0–34.0)
MCHC: 34.5 g/dL (ref 30.0–36.0)
MCV: 80.1 fL (ref 80.0–100.0)
PLATELETS: 229 10*3/uL (ref 150–400)
RBC: 5.54 MIL/uL (ref 4.22–5.81)
RDW: 15.9 % — ABNORMAL HIGH (ref 11.5–15.5)
WBC: 16.4 10*3/uL — AB (ref 4.0–10.5)
nRBC: 0 % (ref 0.0–0.2)

## 2017-11-22 LAB — LIPASE, BLOOD: Lipase: 18 U/L (ref 11–51)

## 2017-11-22 LAB — INFLUENZA PANEL BY PCR (TYPE A & B)
INFLAPCR: NEGATIVE
INFLBPCR: NEGATIVE

## 2017-11-22 MED ORDER — SODIUM CHLORIDE 0.9 % IV BOLUS
1000.0000 mL | Freq: Once | INTRAVENOUS | Status: AC
  Administered 2017-11-22: 1000 mL via INTRAVENOUS

## 2017-11-22 MED ORDER — ACETAMINOPHEN 10 MG/ML IV SOLN
1000.0000 mg | INTRAVENOUS | Status: AC
  Administered 2017-11-22: 1000 mg via INTRAVENOUS
  Filled 2017-11-22: qty 100

## 2017-11-22 MED ORDER — ONDANSETRON 4 MG PO TBDP: 4.0000 mg | ORAL_TABLET | Freq: Three times a day (TID) | ORAL | 0 refills | Status: DC | PRN

## 2017-11-22 MED ORDER — FAMOTIDINE 20 MG PO TABS: 20.0000 mg | ORAL_TABLET | Freq: Two times a day (BID) | ORAL | 0 refills | Status: DC

## 2017-11-22 MED ORDER — FAMOTIDINE IN NACL 20-0.9 MG/50ML-% IV SOLN
20.0000 mg | Freq: Once | INTRAVENOUS | Status: AC
Start: 2017-11-22 — End: 2017-11-22
  Administered 2017-11-22: 20 mg via INTRAVENOUS
  Filled 2017-11-22: qty 50

## 2017-11-22 MED ORDER — HYDROMORPHONE HCL 1 MG/ML IJ SOLN
1.0000 mg | INTRAMUSCULAR | Status: AC
  Administered 2017-11-22: 1 mg via INTRAVENOUS
  Filled 2017-11-22: qty 1

## 2017-11-22 MED ORDER — ONDANSETRON HCL 4 MG/2ML IJ SOLN
4.0000 mg | Freq: Once | INTRAMUSCULAR | Status: AC
  Administered 2017-11-22: 4 mg via INTRAVENOUS
  Filled 2017-11-22: qty 2

## 2017-11-22 NOTE — ED Triage Notes (Signed)
Pt reports NVD since yesterday and now with fever. Pt appears pale in triage.

## 2017-11-22 NOTE — ED Notes (Signed)
Ginger ale given for PO challenge.

## 2017-11-22 NOTE — Discharge Instructions (Addendum)
Your labs and flu test were okay today.  Continue taking medications to control your symptoms, get lots of rest and stay well-hydrated and follow-up with your doctor.  If your symptoms worsen or you have uncontrollable vomiting, return to the emergency room for recheck.

## 2017-11-22 NOTE — ED Provider Notes (Signed)
[facility] Emergency Department Provider Note  ____________________________________________  Time seen: Approximately 9:59 AM  I have reviewed the triage vital signs and the nursing notes.   HISTORY  Chief Complaint Fever; Nausea; Emesis; and Diarrhea    HPI Carl Solis is a 32 y.o. male with a history of avascular necrosis of bilateral hips and chronic migraines who complains of nausea vomiting diarrhea since yesterday evening and fever this morning.  Also has diffuse abdominal pain and bilateral hip pain described as aching and nonradiating.  Diarrhea is watery.  No hematemesis or bloody stool.  No intra-abdominal surgery.  Symptoms are constant, waxing and waning, no alleviating factors so far.  Not tolerating oral intake this morning.  Also notes that he is on chronic opioid therapy after having both hips replaced.  He is weaning down but currently takes long-acting oxycodone daily as well as PRN oxycodone.      Past Medical History:  Diagnosis Date  . Avascular necrosis ([place])   . Migraine   . Migraines      Patient Active Problem List   Diagnosis Date Noted  . Avascular necrosis of bone ([place]) 02/26/2015  . Anxiety disorder 07/25/2014  . Headache, migraine 03/13/2014  . Cephalalgia 09/05/2013  . Disordered sleep 09/05/2013  . Current tobacco use 09/05/2013     Past Surgical History:  Procedure Laterality Date  . ADENOIDECTOMY    . HIP SURGERY Left      Prior to Admission medications   Medication Sig Start Date End Date Taking? Authorizing Provider  Butalbital-APAP-Caffeine 50-300-40 MG CAPS  11/10/13   [provider]  butorphanol (STADOL) 10 MG/ML nasal spray  09/04/14   [provider]  citalopram (CELEXA) 40 MG tablet  03/29/15   [provider]  diazepam (VALIUM) 5 MG tablet  12/30/14   [provider]  famotidine (PEPCID) 20 MG tablet Take 1 tablet (20 mg total) by mouth 2 (two) times daily.  11/22/17   Carrie Mew, MD  fluticasone [facility]) 50 MCG/ACT nasal spray  05/05/15   [provider]  LYRICA 150 MG capsule TK ONE C PO TID 04/17/15   [provider]  ondansetron (ZOFRAN ODT) 4 MG disintegrating tablet Take 1 tablet (4 mg total) by mouth every 8 (eight) hours as needed for nausea or vomiting. 11/22/17   Carrie Mew, MD  oxyCODONE (OXYCONTIN) 40 mg 12 hr tablet Take by mouth.    [provider]  Oxycodone HCl 10 MG TABS  05/10/15   [provider]  oxycodone-acetaminophen (LYNOX) 10-300 MG tablet Take 1 tablet by mouth every 4 (four) hours as needed for pain. Reported on 05/30/2015    [provider]  pantoprazole (PROTONIX) 40 MG tablet Take by mouth. 03/01/15 02/29/16  [provider]  polyethylene glycol (MIRALAX) packet Take 17 g by mouth daily. 06/09/16   Loney Hering, MD  promethazine (PHENERGAN) 12.5 MG tablet Take by mouth.    [provider]  tamsulosin (FLOMAX) 0.4 MG CAPS capsule Reported on 05/30/2015 12/07/13   [provider]  tamsulosin (FLOMAX) 0.4 MG CAPS capsule Take 1 capsule (0.4 mg total) by mouth daily. 08/29/15   Nickie Retort, MD  testosterone cypionate (DEPOTESTOSTERONE CYPIONATE) 200 MG/ML injection Inject 0.34mL every 10 days 07/05/15   Hollice Espy, MD  tiZANidine (ZANAFLEX) 4 MG capsule Take by mouth.    [provider]     Allergies D.h.e. [dihydroergotamine]; Latex; Amoxicillin; Dhea; Eletriptan; Pork-derived products; Rizatriptan; Sulfa antibiotics;  Sulfacetamide sodium; Sumatriptan; Toradol [ketorolac tromethamine]; Triptans; Aspartame and phenylalanine; Bacitracin-neomycin-polymyxin; Ketorolac; and Neosporin [neomycin-bacitracin zn-polymyx]   Family History  Problem Relation Age of Onset  . Brain cancer Father   . Depression Mother   . Anxiety disorder Mother     Social History Social History   Tobacco Use  . Smoking status: Current Every Day  Smoker    Packs/day: 0.10    Years: 10.00    Pack years: 1.00  . Smokeless tobacco: Never Used  Substance Use Topics  . Alcohol use: Yes  . Drug use: No    Review of Systems  Constitutional:   Positive fever and chills.  ENT:   No sore throat. No rhinorrhea. Cardiovascular:   No chest pain or syncope. Respiratory:   No dyspnea or cough. Gastrointestinal:   Positive for generalized abdominal pain as above with vomiting and diarrhea. Musculoskeletal: Positive for bilateral hip pain and low back pain.   All other systems reviewed and are negative except as documented above in ROS and HPI.  ____________________________________________   PHYSICAL EXAM:  VITAL SIGNS: ED Triage Vitals [11/22/17 0845]  Enc Vitals Group     BP 124/84     Pulse Rate (!) 123     Resp 20     Temp 100 F (37.8 C)     Temp Source Oral     SpO2 97 %     Weight 183 lb (83 kg)     Height 5\' 10"  (1.778 m)     Head Circumference      Peak Flow      Pain Score 8     Pain Loc      Pain Edu?      Excl. in [place]?     Vital signs reviewed, nursing assessments reviewed.   Constitutional:   Alert and oriented. Non-toxic appearance. Eyes:   Conjunctivae are normal. EOMI. PERRL. ENT      Head:   Normocephalic and atraumatic.      Nose:   No congestion/rhinnorhea.       Mouth/Throat:   Dry mucous membranes, no pharyngeal erythema. No peritonsillar mass.       Neck:   No meningismus. Full ROM. Hematological/Lymphatic/Immunilogical:   No cervical lymphadenopathy. Cardiovascular:   Tachycardia heart rate 105 on my exam. Symmetric bilateral radial and DP pulses.  No murmurs. Cap refill less than 2 seconds. Respiratory:   Normal respiratory effort without tachypnea/retractions. Breath sounds are clear and equal bilaterally. No wheezes/rales/rhonchi. Gastrointestinal:   Soft with generalized tenderness, nonfocal . Non distended. There is no CVA tenderness.  No rebound, rigidity, or guarding. Musculoskeletal:    Normal range of motion in all extremities. No joint effusions.  No lower extremity tenderness.  No edema.  Hips benign Neurologic:   Normal speech and language.  Motor grossly intact. No acute focal neurologic deficits are appreciated.  Skin:    Skin is warm, clammy.. No rash noted.  No petechiae, purpura, or bullae.  ____________________________________________    LABS (pertinent positives/negatives) (all labs ordered are listed, but only abnormal results are displayed) Labs Reviewed  COMPREHENSIVE METABOLIC PANEL - Abnormal; Notable for the following components:      Result Value   Glucose, Bld 166 (*)    Creatinine, Ser 1.29 (*)    ALT 49 (*)    Total Bilirubin 1.9 (*)    All other components within normal limits  CBC - Abnormal; Notable for the following components:   WBC 16.4 (*)  RDW 15.9 (*)    All other components within normal limits  URINALYSIS, COMPLETE (UACMP) WITH MICROSCOPIC - Abnormal; Notable for the following components:   Color, Urine AMBER (*)    APPearance CLEAR (*)    Specific Gravity, Urine 1.033 (*)    Bilirubin Urine SMALL (*)    Ketones, ur 5 (*)    Protein, ur 100 (*)    All other components within normal limits  LIPASE, BLOOD  INFLUENZA PANEL BY PCR (TYPE A & B)   ____________________________________________   EKG    ____________________________________________    RADIOLOGY  No results found.  ____________________________________________   PROCEDURES Procedures  ____________________________________________    CLINICAL IMPRESSION / ASSESSMENT AND PLAN / ED COURSE  Pertinent labs & imaging results that were available during my care of the patient were reviewed by me and considered in my medical decision making (see chart for details).    Patient presents with low-grade fever, tachycardia, syndrome consistent with influenza-like illness.Considering the patient's symptoms, medical history, and physical examination today, I have  low suspicion for cholecystitis or biliary pathology, pancreatitis, perforation or bowel obstruction, hernia, intra-abdominal abscess, AAA or dissection, volvulus or intussusception, mesenteric ischemia, or appendicitis.  Initial white blood cell count is elevated.  I will give IV fluids, symptom relief, check flu panel.  Reassess for improvement.  If he fails to improve or has more localized pain on reassessment, he may need CT scan of the abdomen to evaluate for an atypical presentation of cholecystitis appendicitis or complicated diverticulitis.   ----------------------------------------- 12:50 PM on 11/22/2017 -----------------------------------------  Patient feeling better, tolerating oral intake.  Symptoms controlled.  Vital signs normal.  Repeat abdominal exam benign.  All consistent with viral gastroenteritis, suitable for outpatient follow-up.  Return precautions given.     ____________________________________________   FINAL CLINICAL IMPRESSION(S) / ED DIAGNOSES    Final diagnoses:  Viral gastroenteritis  Influenza-like illness  Dehydration     ED Discharge Orders         Ordered    ondansetron (ZOFRAN ODT) 4 MG disintegrating tablet  Every 8 hours PRN     11/22/17 1247    famotidine (PEPCID) 20 MG tablet  2 times daily     11/22/17 1247          Portions of this note were generated with dragon dictation software. Dictation errors may occur despite best attempts at proofreading.    Carrie Mew, MD 11/22/17 1251

## 2017-11-22 NOTE — ED Notes (Signed)
Pt provided with mouth swabs to moisturize mouth

## 2017-12-20 ENCOUNTER — Other Ambulatory Visit: Payer: Self-pay

## 2017-12-20 ENCOUNTER — Emergency Department
Admission: EM | Admit: 2017-12-20 | Discharge: 2017-12-20 | Disposition: A | Discharge status: Home / Self Care | Payer: BLUE CROSS/BLUE SHIELD | Attending: Emergency Medicine | Admitting: Emergency Medicine

## 2017-12-20 ENCOUNTER — Encounter: Payer: Self-pay | Admitting: Emergency Medicine

## 2017-12-20 DIAGNOSIS — G43001 Migraine without aura, not intractable, with status migrainosus: Secondary | ICD-10-CM | POA: Diagnosis not present

## 2017-12-20 DIAGNOSIS — F17200 Nicotine dependence, unspecified, uncomplicated: Secondary | ICD-10-CM | POA: Insufficient documentation

## 2017-12-20 DIAGNOSIS — Z91040 Latex allergy status: Secondary | ICD-10-CM | POA: Diagnosis not present

## 2017-12-20 DIAGNOSIS — Z79899 Other long term (current) drug therapy: Secondary | ICD-10-CM | POA: Insufficient documentation

## 2017-12-20 DIAGNOSIS — R51 Headache: Secondary | ICD-10-CM | POA: Diagnosis present

## 2017-12-20 MED ORDER — SODIUM CHLORIDE 0.9 % IV BOLUS
1000.0000 mL | Freq: Once | INTRAVENOUS | Status: AC
  Administered 2017-12-20: 1000 mL via INTRAVENOUS

## 2017-12-20 MED ORDER — DEXAMETHASONE SODIUM PHOSPHATE 10 MG/ML IJ SOLN
10.0000 mg | Freq: Once | INTRAMUSCULAR | Status: AC
  Administered 2017-12-20: 10 mg via INTRAVENOUS
  Filled 2017-12-20: qty 1

## 2017-12-20 MED ORDER — FAMOTIDINE IN NACL 20-0.9 MG/50ML-% IV SOLN
20.0000 mg | Freq: Once | INTRAVENOUS | Status: AC
Start: 2017-12-20 — End: 2017-12-20
  Administered 2017-12-20: 20 mg via INTRAVENOUS
  Filled 2017-12-20: qty 50

## 2017-12-20 MED ORDER — ONDANSETRON HCL 4 MG/2ML IJ SOLN
4.0000 mg | Freq: Once | INTRAMUSCULAR | Status: AC
Start: 2017-12-20 — End: 2017-12-20
  Administered 2017-12-20: 4 mg via INTRAVENOUS
  Filled 2017-12-20: qty 2

## 2017-12-20 MED ORDER — HYDROMORPHONE HCL 1 MG/ML IJ SOLN
1.0000 mg | Freq: Once | INTRAMUSCULAR | Status: AC
  Administered 2017-12-20: 1 mg via INTRAVENOUS
  Filled 2017-12-20: qty 1

## 2017-12-20 NOTE — ED Notes (Signed)
See triage note  Presents with a 3 weeks hx of migraine  States he has tried his regular meds at home w/o relief

## 2017-12-20 NOTE — Discharge Instructions (Addendum)
Continue previous medications and follow-up with your neurologist.

## 2017-12-20 NOTE — ED Triage Notes (Signed)
Sates has had migraine x 3 weeks. Denies use of blood thinners or head injury. States is typical pain of migraine just unrelieved by home meds.

## 2017-12-20 NOTE — ED Provider Notes (Signed)
[facility] Emergency Department Provider Note   ____________________________________________   First MD Initiated Contact with Patient 12/20/17 1412     (approximate)  I have reviewed the triage vital signs and the nursing notes.   HISTORY  Chief Complaint Migraine    HPI Carl Solis is a 32 y.o. male patient presents with migraine headache for 3 weeks.  Patient denies aura.  Patient does have photophobia.  Patient states there is nausea but no vomiting.  Patient state unrelieved by narcotic pain medication consisting of 4 steps from his neurologist.  Patient states neurology is not determined he does not feel that he can make to drive there and back especially to give him his normal IV therapy.  Patient had a recent MRI brain scan on 11/05/2017 which shows no change of his arachnoid cyst.  Patient rates his headache as a 10/10.  Patient described pain as "achy".  No palliative measure today for complaint.  Past Medical History:  Diagnosis Date  . Avascular necrosis ([place])   . Migraine   . Migraines     Patient Active Problem List   Diagnosis Date Noted  . Avascular necrosis of bone ([place]) 02/26/2015  . Anxiety disorder 07/25/2014  . Headache, migraine 03/13/2014  . Cephalalgia 09/05/2013  . Disordered sleep 09/05/2013  . Current tobacco use 09/05/2013    Past Surgical History:  Procedure Laterality Date  . ADENOIDECTOMY    . HIP SURGERY Left     Prior to Admission medications   Medication Sig Start Date End Date Taking? Authorizing Provider  Butalbital-APAP-Caffeine 50-300-40 MG CAPS  11/10/13   [provider]  butorphanol (STADOL) 10 MG/ML nasal spray  09/04/14   [provider]  citalopram (CELEXA) 40 MG tablet  03/29/15   [provider]  diazepam (VALIUM) 5 MG tablet  12/30/14   [provider]  famotidine (PEPCID) 20 MG tablet Take 1 tablet (20 mg total) by mouth 2 (two) times daily. 11/22/17    Carrie Mew, MD  fluticasone [facility]) 50 MCG/ACT nasal spray  05/05/15   [provider]  LYRICA 150 MG capsule TK ONE C PO TID 04/17/15   [provider]  ondansetron (ZOFRAN ODT) 4 MG disintegrating tablet Take 1 tablet (4 mg total) by mouth every 8 (eight) hours as needed for nausea or vomiting. 11/22/17   Carrie Mew, MD  oxyCODONE (OXYCONTIN) 40 mg 12 hr tablet Take by mouth.    [provider]  Oxycodone HCl 10 MG TABS  05/10/15   [provider]  oxycodone-acetaminophen (LYNOX) 10-300 MG tablet Take 1 tablet by mouth every 4 (four) hours as needed for pain. Reported on 05/30/2015    [provider]  pantoprazole (PROTONIX) 40 MG tablet Take by mouth. 03/01/15 02/29/16  [provider]  polyethylene glycol (MIRALAX) packet Take 17 g by mouth daily. 06/09/16   Loney Hering, MD  promethazine (PHENERGAN) 12.5 MG tablet Take by mouth.    [provider]  tamsulosin (FLOMAX) 0.4 MG CAPS capsule Reported on 05/30/2015 12/07/13   [provider]  tamsulosin (FLOMAX) 0.4 MG CAPS capsule Take 1 capsule (0.4 mg total) by mouth daily. 08/29/15   Nickie Retort, MD  testosterone cypionate (DEPOTESTOSTERONE CYPIONATE) 200 MG/ML injection Inject 0.69mL every 10 days 07/05/15   Hollice Espy, MD  tiZANidine (ZANAFLEX) 4 MG capsule Take by mouth.    [provider]    Allergies D.h.e. [dihydroergotamine]; Latex; Amoxicillin; Dhea; Eletriptan; Pork-derived products;  Rizatriptan; Sulfa antibiotics; Sulfacetamide sodium; Sumatriptan; Toradol [ketorolac tromethamine]; Triptans; Aspartame and phenylalanine; Bacitracin-neomycin-polymyxin; Ketorolac; and Neosporin [neomycin-bacitracin zn-polymyx]  Family History  Problem Relation Age of Onset  . Brain cancer Father   . Depression Mother   . Anxiety disorder Mother     Social History Social History   Tobacco Use  . Smoking status: Current Every Day Smoker     Packs/day: 0.10    Years: 10.00    Pack years: 1.00  . Smokeless tobacco: Never Used  Substance Use Topics  . Alcohol use: Yes  . Drug use: No    Review of Systems Constitutional: No fever/chills Eyes: No visual changes. ENT: No sore throat. Cardiovascular: Denies chest pain. Respiratory: Denies shortness of breath. Gastrointestinal: No abdominal pain.  No nausea, no vomiting.  No diarrhea.  No constipation. Genitourinary: Negative for dysuria. Musculoskeletal: Bilateral shoulder and hip pain secondary to AVN. Skin: Negative for rash. Neurological: Positive for headaches, but focal weakness or numbness. Psychiatric:Anxiety. Allergic/Immunilogical: See extensive medication allergy list. ____________________________________________   PHYSICAL EXAM:  VITAL SIGNS: ED Triage Vitals  Enc Vitals Group     BP 12/20/17 1353 133/90     Pulse Rate 12/20/17 1353 (!) 104     Resp 12/20/17 1353 20     Temp 12/20/17 1353 97.6 F (36.4 C)     Temp Source 12/20/17 1353 Oral     SpO2 12/20/17 1353 99 %     Weight 12/20/17 1354 178 lb (80.7 kg)     Height 12/20/17 1354 5\' 10"  (1.778 m)     Head Circumference --      Peak Flow --      Pain Score 12/20/17 1354 10     Pain Loc --      Pain Edu? --      Excl. in [place]? --     Constitutional: Alert and oriented. Well appearing and in no acute distress. Eyes: Deferred secondary to photophobia.  Cardiovascular: Normal rate, regular rhythm. Grossly normal heart sounds.  Good peripheral circulation. Respiratory: Normal respiratory effort.  No retractions. Lungs CTAB. Gastrointestinal: Soft and nontender. No distention. No abdominal bruits. No CVA tenderness. Musculoskeletal: Bilateral lower extremity guarding with palpation.  Neurologic:  Normal speech and language. No gross focal neurologic deficits are appreciated. No gait instability. Skin:  Skin is warm, dry and intact. No rash noted. Psychiatric: Mood and affect are normal. Speech and  behavior are normal.  ____________________________________________   LABS (all labs ordered are listed, but only abnormal results are displayed)  Labs Reviewed - No data to display ____________________________________________  EKG   ____________________________________________  RADIOLOGY  ED MD interpretation:    Official radiology report(s): No results found.  ____________________________________________   PROCEDURES  Procedure(s) performed: None  Procedures  Critical Care performed: No  ____________________________________________   INITIAL IMPRESSION / ASSESSMENT AND PLAN / ED COURSE  As part of my medical decision making, I reviewed the following data within the [place]    Patient presents 3 weeks of migraine headache.  Patient state maintenance medication is not helping.  Patient did receive more than 75% reduction of headache status post IV hydration, Decadron, Dilaudid, Zofran, and Pepcid.  Patient advised to follow-up with her treating neurologist we will continue care.      ____________________________________________   FINAL CLINICAL IMPRESSION(S) / ED DIAGNOSES  Final diagnoses:  Migraine without aura and with status migrainosus, not intractable     ED Discharge Orders    None  Note:  This document was prepared using Dragon voice recognition software and may include unintentional dictation errors.    Sable Feil, PA-C 12/20/17 1536    Eula Listen, MD 12/21/17 [phone]

## 2018-04-01 ENCOUNTER — Emergency Department
Admission: EM | Admit: 2018-04-01 | Discharge: 2018-04-01 | Discharge status: Against Medical Advice | Payer: BLUE CROSS/BLUE SHIELD | Attending: Emergency Medicine | Admitting: Emergency Medicine

## 2018-04-01 ENCOUNTER — Encounter: Payer: Self-pay | Admitting: Emergency Medicine

## 2018-04-01 ENCOUNTER — Other Ambulatory Visit: Payer: Self-pay

## 2018-04-01 DIAGNOSIS — G43909 Migraine, unspecified, not intractable, without status migrainosus: Secondary | ICD-10-CM | POA: Insufficient documentation

## 2018-04-01 DIAGNOSIS — Z5321 Procedure and treatment not carried out due to patient leaving prior to being seen by health care provider: Secondary | ICD-10-CM | POA: Insufficient documentation

## 2018-04-01 NOTE — ED Notes (Signed)
First nurse note: sent from Dr Doy Hutching office with severe migraine, Dr Manuella Ghazi calling to give orders. Pt appears in NAD at this time.

## 2018-04-01 NOTE — ED Notes (Signed)
Pt called from lobby by this tech 3x with no response, first nurse notified

## 2018-04-01 NOTE — ED Notes (Signed)
..  Pt called from lobby 3x by this Probation officer with no response, First RN notified

## 2018-04-01 NOTE — ED Triage Notes (Signed)
C/O migraine headache x "130 days".  C/O generalized pain, global to head.  Patient sees Dr. Manuella Ghazi for neurology, referred patient to ED.  C/O photophobia, and sensitivity to sounds and smells.  Also nausea.  AAOx3.  Skin warm and dry. NAD

## 2018-05-02 ENCOUNTER — Other Ambulatory Visit: Payer: Self-pay

## 2018-05-02 ENCOUNTER — Encounter: Payer: Self-pay | Admitting: Anesthesiology

## 2018-05-02 ENCOUNTER — Ambulatory Visit: Payer: BLUE CROSS/BLUE SHIELD | Attending: Anesthesiology | Admitting: Anesthesiology

## 2018-05-02 VITALS — BP 119/84 | HR 73 | Temp 98.5°F | Resp 16 | Ht 70.0 in | Wt 164.0 lb

## 2018-05-02 DIAGNOSIS — G44221 Chronic tension-type headache, intractable: Secondary | ICD-10-CM | POA: Diagnosis not present

## 2018-05-02 DIAGNOSIS — G894 Chronic pain syndrome: Secondary | ICD-10-CM

## 2018-05-02 DIAGNOSIS — G43111 Migraine with aura, intractable, with status migrainosus: Secondary | ICD-10-CM

## 2018-05-02 DIAGNOSIS — F411 Generalized anxiety disorder: Secondary | ICD-10-CM | POA: Diagnosis not present

## 2018-05-02 DIAGNOSIS — M25511 Pain in right shoulder: Secondary | ICD-10-CM

## 2018-05-02 DIAGNOSIS — F1190 Opioid use, unspecified, uncomplicated: Secondary | ICD-10-CM | POA: Insufficient documentation

## 2018-05-02 DIAGNOSIS — M8700 Idiopathic aseptic necrosis of unspecified bone: Secondary | ICD-10-CM | POA: Diagnosis present

## 2018-05-02 MED ORDER — TIZANIDINE HCL 4 MG PO CAPS: 4.0000 mg | ORAL_CAPSULE | Freq: Two times a day (BID) | ORAL | 3 refills | Status: AC

## 2018-05-02 NOTE — Progress Notes (Signed)
Subjective:  Patient ID: Carl Solis, male    DOB: 1986/01/18  Age: 33 y.o. MRN: [id number]  CC: Headache (miagranes); Shoulder Pain (right,  avascular necrosis); Hip Pain (biilateral, s/p joint replacement bilateral d/t avascular necrosisi); Neck Pain (bialteral); and Back Pain (lumbar, worse on the right )     PROCEDURE: None  HPI Carl Solis presents for a new patient evaluation.  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  Subsequent to this he has had bilateral hip replacement and a syndrome of chronic pain associated with the avascular necrosis.  At present he is having bilateral hip pain and right shoulder pain.  He has been through courses of anti-inflammatories and multiple other medications with failure.  He continues to have pain despite this and has been on opioid regimens that have given him good relief.  He reports a reduction in pain from a pain score of 8-10 down to a 2-3.  He denies any illicit use diverting or illegal use of the medications.  At this point the quality characteristic and distribution of his shoulder pain and bilateral hip pain have been stable and are described as a gnawing aching pain primarily disabling in nature and that keep him from sleeping at night.  He has been seen by neurology also secondary to migraine headaches and they are working on this at present.  He associates depression and inability to concentrate with the pain.  He states that he is rarely ever out of pain and the pain is been getting worse.  The pain is worse with bending climbing kneeling lifting motion standing and squatting.  Rest and medication management seem to help.  He has been seen in a previous pain center over in [place] and then was transferred to Dr. Georgie Chard here in [place] and Dr. Doy Hutching is currently requesting assistance with his opioid medications.  History Carl Solis has a past medical  history of Avascular necrosis ([place]), Migraine, and Migraines.   He has a past surgical history that includes Adenoidectomy and Hip surgery (Left).   His family history includes Anxiety disorder in his mother; Brain cancer in his father; Depression in his mother.He reports that he has been smoking. He has a 1.00 pack-year smoking history. He has never used smokeless tobacco. He reports current alcohol use. He reports that he does not use drugs.  No procedure found.  No results found for: TOXASSSELUR  Outpatient Medications Prior to Visit  Medication Sig Dispense Refill  . Butalbital-APAP-Caffeine 50-300-40 MG CAPS Take 1-2 capsules by mouth as needed. At onset of miagraine    . candesartan (ATACAND) 8 MG tablet Take 1 tablet by mouth daily.    . citalopram (CELEXA) 40 MG tablet Take 40 mg by mouth daily.     . diazepam (VALIUM) 5 MG tablet Take 5 mg by mouth every 8 (eight) hours as needed.     Marland Kitchen EPINEPHrine 0.3 mg/0.3 mL IJ SOAJ injection Inject 0.3 mLs into the skin as needed.    . fluticasone (FLONASE) 50 MCG/ACT nasal spray Place 2 sprays into both nostrils daily.     . ondansetron (ZOFRAN ODT) 4 MG disintegrating tablet Take 1 tablet (4 mg total) by mouth every 8 (eight) hours as needed for nausea or vomiting. 20 tablet 0  . Oxycodone HCl 10 MG TABS Take 10 mg by mouth every 4 (four) hours.   0  . QUEtiapine (SEROQUEL) 25 MG  tablet Take 25 mg by mouth at bedtime.    . butorphanol (STADOL) 10 MG/ML nasal spray     . famotidine (PEPCID) 20 MG tablet Take 1 tablet (20 mg total) by mouth 2 (two) times daily. (Patient not taking: Reported on 05/02/2018) 60 tablet 0  . LYRICA 150 MG capsule TK ONE C PO TID  5  . oxyCODONE (OXYCONTIN) 40 mg 12 hr tablet Take by mouth.    Marland Kitchen oxycodone-acetaminophen (LYNOX) 10-300 MG tablet Take 1 tablet by mouth every 4 (four) hours as needed for pain. Reported on 05/30/2015    . pantoprazole (PROTONIX) 40 MG tablet Take by mouth.    . polyethylene glycol  (MIRALAX) packet Take 17 g by mouth daily. (Patient not taking: Reported on 05/02/2018) 14 each 0  . promethazine (PHENERGAN) 12.5 MG tablet Take by mouth.    . tamsulosin (FLOMAX) 0.4 MG CAPS capsule Reported on 05/30/2015    . tamsulosin (FLOMAX) 0.4 MG CAPS capsule Take 1 capsule (0.4 mg total) by mouth daily. (Patient not taking: Reported on 05/02/2018) 30 capsule 11  . testosterone cypionate (DEPOTESTOSTERONE CYPIONATE) 200 MG/ML injection Inject 0.58mL every 10 days (Patient not taking: Reported on 05/02/2018) 10 mL 0  . tiZANidine (ZANAFLEX) 4 MG capsule Take by mouth.     No facility-administered medications prior to visit.    Lab Results  Component Value Date   WBC 16.4 (H) 11/22/2017   HGB 15.3 11/22/2017   HCT 44.4 11/22/2017   PLT 229 11/22/2017   GLUCOSE 166 (H) 11/22/2017   ALT 49 (H) 11/22/2017   AST 41 11/22/2017   NA 142 11/22/2017   K 3.7 11/22/2017   CL 104 11/22/2017   CREATININE 1.29 (H) 11/22/2017   BUN 9 11/22/2017   CO2 28 11/22/2017    --------------------------------------------------------------------------------------------------------------------- No results found.     ---------------------------------------------------------------------------------------------------------------------- Past Medical History:  Diagnosis Date  . Avascular necrosis ([place])   . Migraine   . Migraines     Past Surgical History:  Procedure Laterality Date  . ADENOIDECTOMY    . HIP SURGERY Left     Family History  Problem Relation Age of Onset  . Brain cancer Father   . Depression Mother   . Anxiety disorder Mother     Social History   Tobacco Use  . Smoking status: Current Every Day Smoker    Packs/day: 0.10    Years: 10.00    Pack years: 1.00  . Smokeless tobacco: Never Used  Substance Use Topics  . Alcohol use: Yes    ---------------------------------------------------------------------------------------------------------------------  Scheduled  Meds: Continuous Infusions: PRN Meds:.   There were no vitals taken for this visit.   BP Readings from Last 3 Encounters:  04/01/18 123/83  12/20/17 123/80  11/22/17 113/85     Wt Readings from Last 3 Encounters:  04/01/18 164 lb (74.4 kg)  12/20/17 178 lb (80.7 kg)  11/22/17 183 lb (83 kg)     ----------------------------------------------------------------------------------------------------------------------  ROS Review of Systems Patient is alert cooperative HEENT no photophobia or present headache Pulmonary: No dyspnea or cough Cardiac: No palpitations or chest pain GI: No diarrhea or constipation Musculoskeletal: Persistent right shoulder pain and bilateral hip pain and cervical neck pain  Objective:  There were no vitals taken for this visit.  Physical Exam this muscle to palpation. His heart is regular rate and rhythm without murmur or rub Lungs are clear to auscultation without wheeze or rhonchi Musculoskeletal reveals patient to have low back paraspinous muscle tenderness.  He ambulates with a mildly antalgic gait.  Muscle strength testing is not performed secondary to the covid circumstances but he denies any weakness anywhere.     Assessment & Plan:   Carl Solis was seen today for headache, shoulder pain, hip pain, neck pain and back pain.  Diagnoses and all orders for this visit:  Avascular necrosis of bone (HCC)  Generalized anxiety disorder  Intractable migraine with aura with status migrainosus  Chronic tension-type headache, intractable  Chronic, continuous use of opioids  Chronic pain syndrome  Pain in joint of right shoulder  Other orders -     tiZANidine (ZANAFLEX) 4 MG capsule; Take 1 capsule (4 mg total) by mouth 2 (two) times daily for 30 days.     ----------------------------------------------------------------------------------------------------------------------  Problem List Items Addressed This Visit      Unprioritized    Anxiety disorder   Avascular necrosis of bone ([place]) - Primary   Cephalalgia   Relevant Medications   tiZANidine (ZANAFLEX) 4 MG capsule   Headache, migraine   Relevant Medications   candesartan (ATACAND) 8 MG tablet   EPINEPHrine 0.3 mg/0.3 mL IJ SOAJ injection   tiZANidine (ZANAFLEX) 4 MG capsule    Other Visit Diagnoses    Chronic, continuous use of opioids       Chronic pain syndrome       Relevant Medications   tiZANidine (ZANAFLEX) 4 MG capsule   Pain in joint of right shoulder          ----------------------------------------------------------------------------------------------------------------------  1. Avascular necrosis of bone ([place]) Unfortunately he has failed conservative therapy and required chronic opioid maintenance therapy.  We talked about the risks and benefits of chronic opioid maintenance therapy and the clinic policy regarding the narcotic contract and the frequency with which she needs to follow-up with [place].  A urine drug screen will be requested today and pending that we will initiate Percocet therapy as discussed 1 tablet or 2 tablets 3 times a day.  2. Generalized anxiety disorder Continue follow-up with his primary care physicians.  3. Intractable migraine with aura with status migrainosus Continue follow-up with neurology  4. Chronic tension-type headache, intractable As above  5. Chronic, continuous use of opioids We have reviewed the practitioner database information today.  6. Chronic pain syndrome As above  7. Pain in joint of right shoulder As above    ----------------------------------------------------------------------------------------------------------------------  I have changed Abrar Debnam's tiZANidine. I am also having him maintain his Butalbital-APAP-Caffeine, butorphanol, citalopram, tamsulosin, promethazine, pantoprazole, oxyCODONE, diazepam, Lyrica, fluticasone, oxycodone-acetaminophen, Oxycodone HCl, testosterone  cypionate, tamsulosin, polyethylene glycol, ondansetron, famotidine, candesartan, EPINEPHrine, and QUEtiapine.   Meds ordered this encounter  Medications  . tiZANidine (ZANAFLEX) 4 MG capsule    Sig: Take 1 capsule (4 mg total) by mouth 2 (two) times daily for 30 days.    Dispense:  60 capsule    Refill:  3       Follow-up: Return in about 1 month (around 06/02/2018) for evaluation, med refill.    Molli Barrows, MD 2:15 PM  The [facility] practitioner database for opioid medications on this patient has been reviewed by me and my staff   Greater than 50% of the total encounter time was spent in counseling and / or coordination of care.     This dictation was performed utilizing Systems analyst.  Please excuse any unintentional or mistaken typographical errors as a result.

## 2018-05-02 NOTE — Progress Notes (Signed)
Safety precautions to be maintained throughout the outpatient stay will include: orient to surroundings, keep bed in low position, maintain call bell within reach at all times, provide assistance with transfer out of bed and ambulation.  

## 2018-05-05 ENCOUNTER — Encounter: Payer: Self-pay | Admitting: Anesthesiology

## 2018-05-05 ENCOUNTER — Other Ambulatory Visit: Payer: Self-pay | Admitting: *Deleted

## 2018-05-05 DIAGNOSIS — G894 Chronic pain syndrome: Secondary | ICD-10-CM

## 2018-05-17 ENCOUNTER — Telehealth: Payer: Self-pay | Admitting: Anesthesiology

## 2018-05-17 ENCOUNTER — Encounter: Payer: Self-pay | Admitting: Anesthesiology

## 2018-05-17 NOTE — Telephone Encounter (Signed)
Pt called and wanted to know if he could come in for a UDS

## 2018-05-26 NOTE — Progress Notes (Signed)
Talked with manager and she states that patient can still do a follow up appointment.

## 2018-05-30 ENCOUNTER — Other Ambulatory Visit: Payer: Self-pay

## 2018-05-30 ENCOUNTER — Ambulatory Visit: Payer: BLUE CROSS/BLUE SHIELD | Attending: Anesthesiology | Admitting: Anesthesiology

## 2018-05-30 ENCOUNTER — Encounter: Payer: Self-pay | Admitting: Anesthesiology

## 2018-05-30 DIAGNOSIS — G43111 Migraine with aura, intractable, with status migrainosus: Secondary | ICD-10-CM | POA: Diagnosis not present

## 2018-05-30 DIAGNOSIS — M8700 Idiopathic aseptic necrosis of unspecified bone: Secondary | ICD-10-CM

## 2018-05-30 DIAGNOSIS — G894 Chronic pain syndrome: Secondary | ICD-10-CM | POA: Diagnosis not present

## 2018-05-30 DIAGNOSIS — M19012 Primary osteoarthritis, left shoulder: Secondary | ICD-10-CM

## 2018-05-30 DIAGNOSIS — M19011 Primary osteoarthritis, right shoulder: Secondary | ICD-10-CM

## 2018-05-30 DIAGNOSIS — F1190 Opioid use, unspecified, uncomplicated: Secondary | ICD-10-CM

## 2018-05-30 DIAGNOSIS — G44221 Chronic tension-type headache, intractable: Secondary | ICD-10-CM

## 2018-05-30 DIAGNOSIS — F411 Generalized anxiety disorder: Secondary | ICD-10-CM

## 2018-05-30 MED ORDER — HYDROMORPHONE HCL 4 MG PO TABS: 4.0000 mg | ORAL_TABLET | ORAL | 0 refills | Status: AC | PRN

## 2018-05-30 NOTE — Progress Notes (Signed)
Subjective:  Patient ID: Carl Solis, male    DOB: 05/17/1985  Age: 33 y.o. MRN: [id number]  CC: No chief complaint on file.   Procedure: None  HPI Carl Solis presents for video conference secondary to the COVID crisis.  Upon identification we discussed his issues regarding his chronic headaches and bilateral hip pain and shoulder pain.  He has recently transitioned over to Dilaudid per Dr. Doy Hutching.  He also follows with Dr. Brigitte Pulse his neurologist.  They felt this was an appropriate switch over from other opioids and this is working better for him.  Based on our discussion today he is getting good relief using this approximately 4 times a day and occasionally fifth time of day.  No side effects are reported with the medications.  Otherwise he is in his usual state of health.  He denies any diverting or illicit use  Outpatient Medications Prior to Visit  Medication Sig Dispense Refill  . Butalbital-APAP-Caffeine 50-300-40 MG CAPS Take 1-2 capsules by mouth as needed. At onset of miagraine    . butorphanol (STADOL) 10 MG/ML nasal spray     . candesartan (ATACAND) 8 MG tablet Take 1 tablet by mouth daily.    . citalopram (CELEXA) 40 MG tablet Take 40 mg by mouth daily.     . diazepam (VALIUM) 5 MG tablet Take 5 mg by mouth every 8 (eight) hours as needed.     Marland Kitchen EPINEPHrine 0.3 mg/0.3 mL IJ SOAJ injection Inject 0.3 mLs into the skin as needed.    . famotidine (PEPCID) 20 MG tablet Take 1 tablet (20 mg total) by mouth 2 (two) times daily. (Patient not taking: Reported on 05/02/2018) 60 tablet 0  . fluticasone (FLONASE) 50 MCG/ACT nasal spray Place 2 sprays into both nostrils daily.     Marland Kitchen LYRICA 150 MG capsule TK ONE C PO TID  5  . ondansetron (ZOFRAN ODT) 4 MG disintegrating tablet Take 1 tablet (4 mg total) by mouth every 8 (eight) hours as needed for nausea or vomiting. 20 tablet 0  . oxyCODONE (OXYCONTIN) 40 mg 12 hr tablet Take by mouth.    . Oxycodone HCl 10 MG TABS Take 10 mg by mouth  every 4 (four) hours.   0  . oxycodone-acetaminophen (LYNOX) 10-300 MG tablet Take 1 tablet by mouth every 4 (four) hours as needed for pain. Reported on 05/30/2015    . pantoprazole (PROTONIX) 40 MG tablet Take by mouth.    . polyethylene glycol (MIRALAX) packet Take 17 g by mouth daily. (Patient not taking: Reported on 05/02/2018) 14 each 0  . promethazine (PHENERGAN) 12.5 MG tablet Take by mouth.    . QUEtiapine (SEROQUEL) 25 MG tablet Take 25 mg by mouth at bedtime.    . tamsulosin (FLOMAX) 0.4 MG CAPS capsule Reported on 05/30/2015    . tamsulosin (FLOMAX) 0.4 MG CAPS capsule Take 1 capsule (0.4 mg total) by mouth daily. (Patient not taking: Reported on 05/02/2018) 30 capsule 11  . testosterone cypionate (DEPOTESTOSTERONE CYPIONATE) 200 MG/ML injection Inject 0.13mL every 10 days (Patient not taking: Reported on 05/02/2018) 10 mL 0  . tiZANidine (ZANAFLEX) 4 MG capsule Take 1 capsule (4 mg total) by mouth 2 (two) times daily for 30 days. 60 capsule 3   No facility-administered medications prior to visit.     Review of Systems CNS: No confusion or sedation Cardiac: No angina or palpitations GI: No abdominal pain or constipation Constitutional: No nausea vomiting fevers or chills  Objective:  There were no vitals taken for this visit.   BP Readings from Last 3 Encounters:  05/02/18 119/84  04/01/18 123/83  12/20/17 123/80     Wt Readings from Last 3 Encounters:  05/02/18 164 lb (74.4 kg)  04/01/18 164 lb (74.4 kg)  12/20/17 178 lb (80.7 kg)     Physical Exam Deferred secondary to the COVID crisis Labs  No results found for: HGBA1C Lab Results  Component Value Date   CREATININE 1.29 (H) 11/22/2017    -------------------------------------------------------------------------------------------------------------------- Lab Results  Component Value Date   WBC 16.4 (H) 11/22/2017   HGB 15.3 11/22/2017   HCT 44.4 11/22/2017   PLT 229 11/22/2017   GLUCOSE 166 (H) 11/22/2017    ALT 49 (H) 11/22/2017   AST 41 11/22/2017   NA 142 11/22/2017   K 3.7 11/22/2017   CL 104 11/22/2017   CREATININE 1.29 (H) 11/22/2017   BUN 9 11/22/2017   CO2 28 11/22/2017    --------------------------------------------------------------------------------------------------------------------- No results found.   Assessment & Plan:   Diagnoses and all orders for this visit:  Chronic pain syndrome -     ToxASSURE Select 13 (MW), Urine  Avascular necrosis of bone (HCC)  Generalized anxiety disorder  Intractable migraine with aura with status migrainosus  Chronic tension-type headache, intractable  Osteoarthritis of both shoulders, unspecified osteoarthritis type  Chronic, continuous use of opioids -     ToxASSURE Select 13 (MW), Urine  Other orders -     HYDROmorphone (DILAUDID) 4 MG tablet; Take 1 tablet (4 mg total) by mouth every 4 (four) hours as needed for up to 30 days for severe pain.        ----------------------------------------------------------------------------------------------------------------------  Problem List Items Addressed This Visit      Unprioritized   Anxiety disorder   Avascular necrosis of bone (HCC)   Cephalalgia   Relevant Medications   HYDROmorphone (DILAUDID) 4 MG tablet (Start on 06/05/2018)   Headache, migraine   Relevant Medications   HYDROmorphone (DILAUDID) 4 MG tablet (Start on 06/05/2018)    Other Visit Diagnoses    Chronic pain syndrome    -  Primary   Relevant Medications   HYDROmorphone (DILAUDID) 4 MG tablet (Start on 06/05/2018)   Other Relevant Orders   ToxASSURE Select 13 (MW), Urine   Osteoarthritis of both shoulders, unspecified osteoarthritis type       Relevant Medications   HYDROmorphone (DILAUDID) 4 MG tablet (Start on 06/05/2018)   Chronic, continuous use of opioids       Relevant Orders   ToxASSURE Select 13 (MW), Urine         ----------------------------------------------------------------------------------------------------------------------  1. Chronic pain syndrome I am going to schedule him for a urine drug screen within the next few days.  I am also going to refill his medicine for the Dilaudid 4 mg tablets to be taken up to 5 times a day every 4 hours.  This will be sent in and I have reviewed the practitioner database information for the state of [place] and it is appropriate.  This will be dated for May.  He is to return to clinic in 1 month for reevaluation. - ToxASSURE Select 13 (MW), Urine  2. Avascular necrosis of bone ([place]) Continue follow-up with orthopedics and primary care physicians.  3. Generalized anxiety disorder Continue follow-up with Dr. Manuella Ghazi and Dr. Doy Hutching  4. Intractable migraine with aura with status migrainosus As above  5. Chronic tension-type headache, intractable As above  6. Osteoarthritis of both shoulders,  unspecified osteoarthritis type Follow-up with orthopedics and he is scheduled for an ultimate bilateral shoulder reconstruction  7. Chronic, continuous use of opioids As above - ToxASSURE Select 13 (MW), Urine    ----------------------------------------------------------------------------------------------------------------------  I am having [name] start on HYDROmorphone. I am also having him maintain his Butalbital-APAP-Caffeine, butorphanol, citalopram, tamsulosin, promethazine, pantoprazole, oxyCODONE, diazepam, Lyrica, fluticasone, oxycodone-acetaminophen, Oxycodone HCl, testosterone cypionate, tamsulosin, polyethylene glycol, ondansetron, famotidine, candesartan, EPINEPHrine, QUEtiapine, and tiZANidine.   Meds ordered this encounter  Medications  . HYDROmorphone (DILAUDID) 4 MG tablet    Sig: Take 1 tablet (4 mg total) by mouth every 4 (four) hours as needed for up to 30 days for severe pain.    Dispense:  150 tablet    Refill:  0    Patient's Medications  New Prescriptions   HYDROMORPHONE (DILAUDID) 4 MG TABLET    Take 1 tablet (4 mg total) by mouth every 4 (four) hours as needed for up to 30 days for severe pain.  Previous Medications   BUTALBITAL-APAP-CAFFEINE 50-300-40 MG CAPS    Take 1-2 capsules by mouth as needed. At onset of miagraine   BUTORPHANOL (STADOL) 10 MG/ML NASAL SPRAY       CANDESARTAN (ATACAND) 8 MG TABLET    Take 1 tablet by mouth daily.   CITALOPRAM (CELEXA) 40 MG TABLET    Take 40 mg by mouth daily.    DIAZEPAM (VALIUM) 5 MG TABLET    Take 5 mg by mouth every 8 (eight) hours as needed.    EPINEPHRINE 0.3 MG/0.3 ML IJ SOAJ INJECTION    Inject 0.3 mLs into the skin as needed.   FAMOTIDINE (PEPCID) 20 MG TABLET    Take 1 tablet (20 mg total) by mouth 2 (two) times daily.   FLUTICASONE (FLONASE) 50 MCG/ACT NASAL SPRAY    Place 2 sprays into both nostrils daily.    LYRICA 150 MG CAPSULE    TK ONE C PO TID   ONDANSETRON (ZOFRAN ODT) 4 MG DISINTEGRATING TABLET    Take 1 tablet (4 mg total) by mouth every 8 (eight) hours as needed for nausea or vomiting.   OXYCODONE (OXYCONTIN) 40 MG 12 HR TABLET    Take by mouth.   OXYCODONE HCL 10 MG TABS    Take 10 mg by mouth every 4 (four) hours.    OXYCODONE-ACETAMINOPHEN (LYNOX) 10-300 MG TABLET    Take 1 tablet by mouth every 4 (four) hours as needed for pain. Reported on 05/30/2015   PANTOPRAZOLE (PROTONIX) 40 MG TABLET    Take by mouth.   POLYETHYLENE GLYCOL (MIRALAX) PACKET    Take 17 g by mouth daily.   PROMETHAZINE (PHENERGAN) 12.5 MG TABLET    Take by mouth.   QUETIAPINE (SEROQUEL) 25 MG TABLET    Take 25 mg by mouth at bedtime.   TAMSULOSIN (FLOMAX) 0.4 MG CAPS CAPSULE    Reported on 05/30/2015   TAMSULOSIN (FLOMAX) 0.4 MG CAPS CAPSULE    Take 1 capsule (0.4 mg total) by mouth daily.   TESTOSTERONE CYPIONATE (DEPOTESTOSTERONE CYPIONATE) 200 MG/ML INJECTION    Inject 0.41mL every 10 days   TIZANIDINE (ZANAFLEX) 4 MG CAPSULE    Take 1 capsule (4 mg total) by  mouth 2 (two) times daily for 30 days.  Modified Medications   No medications on file  Discontinued Medications   No medications on file   ----------------------------------------------------------------------------------------------------------------------  Follow-up: No follow-ups on file.    Molli Barrows, MD

## 2018-06-04 LAB — TOXASSURE SELECT 13 (MW), URINE

## 2018-07-03 ENCOUNTER — Encounter: Payer: Self-pay | Admitting: Anesthesiology

## 2018-07-06 ENCOUNTER — Encounter: Payer: Self-pay | Admitting: Anesthesiology

## 2018-07-06 ENCOUNTER — Ambulatory Visit: Payer: BLUE CROSS/BLUE SHIELD | Attending: Anesthesiology | Admitting: Anesthesiology

## 2018-07-06 ENCOUNTER — Other Ambulatory Visit: Payer: Self-pay

## 2018-07-06 DIAGNOSIS — M19011 Primary osteoarthritis, right shoulder: Secondary | ICD-10-CM

## 2018-07-06 DIAGNOSIS — F1190 Opioid use, unspecified, uncomplicated: Secondary | ICD-10-CM

## 2018-07-06 DIAGNOSIS — G43111 Migraine with aura, intractable, with status migrainosus: Secondary | ICD-10-CM | POA: Diagnosis not present

## 2018-07-06 DIAGNOSIS — M8700 Idiopathic aseptic necrosis of unspecified bone: Secondary | ICD-10-CM | POA: Diagnosis not present

## 2018-07-06 DIAGNOSIS — M19012 Primary osteoarthritis, left shoulder: Secondary | ICD-10-CM

## 2018-07-06 DIAGNOSIS — G44221 Chronic tension-type headache, intractable: Secondary | ICD-10-CM

## 2018-07-06 DIAGNOSIS — G894 Chronic pain syndrome: Secondary | ICD-10-CM | POA: Diagnosis not present

## 2018-07-06 DIAGNOSIS — F411 Generalized anxiety disorder: Secondary | ICD-10-CM | POA: Diagnosis not present

## 2018-07-06 MED ORDER — HYDROMORPHONE HCL 4 MG PO TABS: 4.0000 mg | ORAL_TABLET | ORAL | 0 refills | Status: DC | PRN

## 2018-07-06 NOTE — Progress Notes (Signed)
Virtual Visit via Telephone Note  I connected with Carl Solis on 07/06/18 at  1:00 PM EDT by telephone and verified that I am speaking with the correct person using two identifiers.  Location: Patient: Home Provider: Pain control center   I discussed the limitations, risks, security and privacy concerns of performing an evaluation and management service by telephone and the availability of in person appointments. I also discussed with the patient that there may be a patient responsible charge related to this service. The patient expressed understanding and agreed to proceed.   History of Present Illness: I spoke to Carl Solis via telephone.  We were unable to achieve video conferencing.  Per our conversation today, he reports that he is doing well with his medication.  Is taking his medications as prescribed and these are working well for him.  He denies any side effects.  The medications are keeping his hip and shoulder pain and low back pain under good control.  The quality characteristic and distribution of these pains are stable in nature and no other changes in lower extremity strength or function are reported.  Based on his response he is deriving good functional improvement in his overall condition without side effect from the medication.    Observations/Objective:  Current Outpatient Medications:  .  Butalbital-APAP-Caffeine 50-300-40 MG CAPS, Take 1-2 capsules by mouth as needed. At onset of miagraine, Disp: , Rfl:  .  butorphanol (STADOL) 10 MG/ML nasal spray, , Disp: , Rfl:  .  candesartan (ATACAND) 8 MG tablet, Take 1 tablet by mouth daily., Disp: , Rfl:  .  citalopram (CELEXA) 40 MG tablet, Take 40 mg by mouth daily. , Disp: , Rfl:  .  diazepam (VALIUM) 5 MG tablet, Take 5 mg by mouth every 8 (eight) hours as needed. , Disp: , Rfl:  .  EPINEPHrine 0.3 mg/0.3 mL IJ SOAJ injection, Inject 0.3 mLs into the skin as needed., Disp: , Rfl:  .  famotidine (PEPCID) 20 MG tablet, Take  1 tablet (20 mg total) by mouth 2 (two) times daily. (Patient not taking: Reported on 05/02/2018), Disp: 60 tablet, Rfl: 0 .  fluticasone (FLONASE) 50 MCG/ACT nasal spray, Place 2 sprays into both nostrils daily. , Disp: , Rfl:  .  HYDROmorphone (DILAUDID) 4 MG tablet, Take 1 tablet (4 mg total) by mouth every 4 (four) hours as needed for up to 30 days for severe pain., Disp: 150 tablet, Rfl: 0 .  LYRICA 150 MG capsule, TK ONE C PO TID, Disp: , Rfl: 5 .  ondansetron (ZOFRAN ODT) 4 MG disintegrating tablet, Take 1 tablet (4 mg total) by mouth every 8 (eight) hours as needed for nausea or vomiting., Disp: 20 tablet, Rfl: 0 .  oxyCODONE (OXYCONTIN) 40 mg 12 hr tablet, Take by mouth., Disp: , Rfl:  .  Oxycodone HCl 10 MG TABS, Take 10 mg by mouth every 4 (four) hours. , Disp: , Rfl: 0 .  oxycodone-acetaminophen (LYNOX) 10-300 MG tablet, Take 1 tablet by mouth every 4 (four) hours as needed for pain. Reported on 05/30/2015, Disp: , Rfl:  .  pantoprazole (PROTONIX) 40 MG tablet, Take by mouth., Disp: , Rfl:  .  polyethylene glycol (MIRALAX) packet, Take 17 g by mouth daily. (Patient not taking: Reported on 05/02/2018), Disp: 14 each, Rfl: 0 .  promethazine (PHENERGAN) 12.5 MG tablet, Take by mouth., Disp: , Rfl:  .  QUEtiapine (SEROQUEL) 25 MG tablet, Take 25 mg by mouth at bedtime., Disp: , Rfl:  .  tamsulosin (FLOMAX) 0.4 MG CAPS capsule, Reported on 05/30/2015, Disp: , Rfl:  .  tamsulosin (FLOMAX) 0.4 MG CAPS capsule, Take 1 capsule (0.4 mg total) by mouth daily. (Patient not taking: Reported on 05/02/2018), Disp: 30 capsule, Rfl: 11 .  testosterone cypionate (DEPOTESTOSTERONE CYPIONATE) 200 MG/ML injection, Inject 0.1mL every 10 days (Patient not taking: Reported on 05/02/2018), Disp: 10 mL, Rfl: 0   Assessment and Plan: 1. Chronic pain syndrome   2. Avascular necrosis of bone ([place])   3. Generalized anxiety disorder   4. Intractable migraine with aura with status migrainosus   5. Chronic tension-type  headache, intractable   6. Osteoarthritis of both shoulders, unspecified osteoarthritis type   7. Chronic, continuous use of opioids   Based on our discussion today and upon review of the [facility] practitioner database information I am going to refill his Dilaudid 4 mg tablets to be taken 4 times a day up to 5 times a day as needed for pain dated for June 3 and I am scheduling him for return to clinic in 1 month.  I have also instructed him to continue follow-up with his primary care physicians for his baseline medical care.   Follow Up Instructions:    I discussed the assessment and treatment plan with the patient. The patient was provided an opportunity to ask questions and all were answered. The patient agreed with the plan and demonstrated an understanding of the instructions.   The patient was advised to call back or seek an in-person evaluation if the symptoms worsen or if the condition fails to improve as anticipated.  I provided 20 minutes of non-face-to-face time during this encounter.   Molli Barrows, MD

## 2018-07-07 ENCOUNTER — Telehealth: Payer: Self-pay | Admitting: *Deleted

## 2018-07-07 ENCOUNTER — Encounter: Payer: Self-pay | Admitting: Anesthesiology

## 2018-07-07 NOTE — Telephone Encounter (Signed)
ICD-10 given to pharmacist. They can now fill the script. Patient notified.

## 2018-07-07 NOTE — Telephone Encounter (Signed)
[facility] [address] still does not have his script that Dr. Andree Elk said he sent over yesterday. Please look into this, he has 2 pills left.

## 2018-07-19 ENCOUNTER — Observation Stay: Payer: BC Managed Care – PPO | Admitting: Certified Registered Nurse Anesthetist

## 2018-07-19 ENCOUNTER — Emergency Department: Payer: BC Managed Care – PPO

## 2018-07-19 ENCOUNTER — Other Ambulatory Visit: Payer: Self-pay

## 2018-07-19 ENCOUNTER — Encounter: Payer: Self-pay | Admitting: Emergency Medicine

## 2018-07-19 ENCOUNTER — Encounter
Admission: EM | Disposition: A | Discharge status: Home / Self Care | Payer: Self-pay | Source: Home / Self Care | Attending: Emergency Medicine

## 2018-07-19 ENCOUNTER — Observation Stay
Admission: EM | Admit: 2018-07-19 | Discharge: 2018-07-20 | Disposition: A | Discharge status: Home / Self Care | Payer: BC Managed Care – PPO | Attending: General Surgery | Admitting: General Surgery

## 2018-07-19 DIAGNOSIS — G43909 Migraine, unspecified, not intractable, without status migrainosus: Secondary | ICD-10-CM | POA: Diagnosis not present

## 2018-07-19 DIAGNOSIS — K810 Acute cholecystitis: Secondary | ICD-10-CM | POA: Diagnosis present

## 2018-07-19 DIAGNOSIS — R1011 Right upper quadrant pain: Secondary | ICD-10-CM

## 2018-07-19 DIAGNOSIS — K8012 Calculus of gallbladder with acute and chronic cholecystitis without obstruction: Secondary | ICD-10-CM | POA: Diagnosis not present

## 2018-07-19 DIAGNOSIS — Z96643 Presence of artificial hip joint, bilateral: Secondary | ICD-10-CM | POA: Diagnosis not present

## 2018-07-19 DIAGNOSIS — Z79899 Other long term (current) drug therapy: Secondary | ICD-10-CM | POA: Insufficient documentation

## 2018-07-19 DIAGNOSIS — F17210 Nicotine dependence, cigarettes, uncomplicated: Secondary | ICD-10-CM | POA: Diagnosis not present

## 2018-07-19 DIAGNOSIS — Z1159 Encounter for screening for other viral diseases: Secondary | ICD-10-CM | POA: Diagnosis not present

## 2018-07-19 DIAGNOSIS — Z79891 Long term (current) use of opiate analgesic: Secondary | ICD-10-CM | POA: Diagnosis not present

## 2018-07-19 HISTORY — PX: CHOLECYSTECTOMY: SHX55

## 2018-07-19 LAB — COMPREHENSIVE METABOLIC PANEL
ALT: 10 U/L (ref 0–44)
AST: 16 U/L (ref 15–41)
Albumin: 4.6 g/dL (ref 3.5–5.0)
Alkaline Phosphatase: 59 U/L (ref 38–126)
Anion gap: 14 (ref 5–15)
BUN: <5 mg/dL — ABNORMAL LOW (ref 6–20)
CO2: 28 mmol/L (ref 22–32)
Calcium: 10.2 mg/dL (ref 8.9–10.3)
Chloride: 101 mmol/L (ref 98–111)
Creatinine, Ser: 1.18 mg/dL (ref 0.61–1.24)
GFR calc Af Amer: >60 mL/min (ref >60)
GFR calc non Af Amer: >60 mL/min (ref >60)
Glucose, Bld: 139 mg/dL — ABNORMAL HIGH (ref 70–99)
Potassium: 4.6 mmol/L (ref 3.5–5.1)
Sodium: 143 mmol/L (ref 135–145)
Total Bilirubin: 0.5 mg/dL (ref 0.3–1.2)
Total Protein: 7.1 g/dL (ref 6.5–8.1)

## 2018-07-19 LAB — CBC
HCT: 45.3 % (ref 39.0–52.0)
Hemoglobin: 15.5 g/dL (ref 13.0–17.0)
MCH: 29.2 pg (ref 26.0–34.0)
MCHC: 34.2 g/dL (ref 30.0–36.0)
MCV: 85.3 fL (ref 80.0–100.0)
Platelets: 245 10*3/uL (ref 150–400)
RBC: 5.31 MIL/uL (ref 4.22–5.81)
RDW: 13.9 % (ref 11.5–15.5)
WBC: 17.1 10*3/uL — ABNORMAL HIGH (ref 4.0–10.5)
nRBC: 0 % (ref 0.0–0.2)

## 2018-07-19 LAB — URINALYSIS, COMPLETE (UACMP) WITH MICROSCOPIC
Bacteria, UA: NONE SEEN
Bilirubin Urine: NEGATIVE
Glucose, UA: NEGATIVE mg/dL
Hgb urine dipstick: NEGATIVE
Ketones, ur: NEGATIVE mg/dL
Leukocytes,Ua: NEGATIVE
Nitrite: NEGATIVE
Protein, ur: 30 mg/dL — AB
Specific Gravity, Urine: 1.023 (ref 1.005–1.030)
Squamous Epithelial / HPF: NONE SEEN (ref 0–5)
pH: 6 (ref 5.0–8.0)

## 2018-07-19 LAB — SARS CORONAVIRUS 2 BY RT PCR (HOSPITAL ORDER, PERFORMED IN ~~LOC~~ HOSPITAL LAB): SARS Coronavirus 2: NEGATIVE

## 2018-07-19 LAB — LIPASE, BLOOD: Lipase: 21 U/L (ref 11–51)

## 2018-07-19 SURGERY — LAPAROSCOPIC CHOLECYSTECTOMY
Anesthesia: General | Site: Abdomen

## 2018-07-19 MED ORDER — FENTANYL CITRATE (PF) 100 MCG/2ML IJ SOLN
INTRAMUSCULAR | Status: AC
  Administered 2018-07-19: 21:00:00 50 ug via INTRAVENOUS
  Filled 2018-07-19: qty 2

## 2018-07-19 MED ORDER — BUPIVACAINE-EPINEPHRINE (PF) 0.5% -1:200000 IJ SOLN
INTRAMUSCULAR | Status: DC | PRN
  Administered 2018-07-19: 27 mL via PERINEURAL

## 2018-07-19 MED ORDER — HYDROMORPHONE HCL 1 MG/ML IJ SOLN
1.0000 mg | Freq: Once | INTRAMUSCULAR | Status: AC
  Administered 2018-07-19: 13:00:00 1 mg via INTRAVENOUS
  Filled 2018-07-19: qty 1

## 2018-07-19 MED ORDER — PHENYLEPHRINE HCL (PRESSORS) 10 MG/ML IV SOLN
INTRAVENOUS | Status: DC | PRN
  Administered 2018-07-19: 100 ug via INTRAVENOUS

## 2018-07-19 MED ORDER — FENTANYL CITRATE (PF) 100 MCG/2ML IJ SOLN
INTRAMUSCULAR | Status: DC | PRN
  Administered 2018-07-19 (×3): 50 ug via INTRAVENOUS
  Administered 2018-07-19 (×2): 25 ug via INTRAVENOUS
  Administered 2018-07-19 (×2): 50 ug via INTRAVENOUS

## 2018-07-19 MED ORDER — HYDROCODONE-ACETAMINOPHEN 5-325 MG PO TABS: 1.0000 | ORAL_TABLET | ORAL | Status: DC | PRN

## 2018-07-19 MED ORDER — PROMETHAZINE HCL 25 MG/ML IJ SOLN
25.0000 mg | Freq: Once | INTRAMUSCULAR | Status: AC
  Administered 2018-07-19: 13:00:00 25 mg via INTRAVENOUS
  Filled 2018-07-19: qty 1

## 2018-07-19 MED ORDER — ENOXAPARIN SODIUM 40 MG/0.4ML ~~LOC~~ SOLN
40.0000 mg | SUBCUTANEOUS | Status: DC
  Administered 2018-07-19: 22:00:00 40 mg via SUBCUTANEOUS
  Filled 2018-07-19: qty 0.4

## 2018-07-19 MED ORDER — LIDOCAINE HCL (CARDIAC) PF 100 MG/5ML IV SOSY
PREFILLED_SYRINGE | INTRAVENOUS | Status: DC | PRN
  Administered 2018-07-19: 100 mg via INTRAVENOUS

## 2018-07-19 MED ORDER — MORPHINE SULFATE (PF) 4 MG/ML IV SOLN
4.0000 mg | INTRAVENOUS | Status: DC | PRN
  Administered 2018-07-19 – 2018-07-20 (×5): 4 mg via INTRAVENOUS
  Filled 2018-07-19 (×5): qty 1

## 2018-07-19 MED ORDER — ACETAMINOPHEN 10 MG/ML IV SOLN
INTRAVENOUS | Status: AC
  Filled 2018-07-19: qty 100

## 2018-07-19 MED ORDER — SODIUM CHLORIDE 0.9% FLUSH
3.0000 mL | Freq: Once | INTRAVENOUS | Status: AC
  Administered 2018-07-19: 12:00:00 3 mL via INTRAVENOUS

## 2018-07-19 MED ORDER — PROMETHAZINE HCL 25 MG/ML IJ SOLN
25.0000 mg | Freq: Four times a day (QID) | INTRAMUSCULAR | Status: DC | PRN
  Administered 2018-07-19 – 2018-07-20 (×2): 25 mg via INTRAVENOUS
  Filled 2018-07-19 (×2): qty 1

## 2018-07-19 MED ORDER — SUGAMMADEX SODIUM 200 MG/2ML IV SOLN
INTRAVENOUS | Status: AC
  Filled 2018-07-19: qty 2

## 2018-07-19 MED ORDER — METRONIDAZOLE IN NACL 5-0.79 MG/ML-% IV SOLN
500.0000 mg | Freq: Three times a day (TID) | INTRAVENOUS | Status: DC
  Administered 2018-07-20 (×2): 500 mg via INTRAVENOUS
  Filled 2018-07-19 (×4): qty 100

## 2018-07-19 MED ORDER — ACETAMINOPHEN 650 MG RE SUPP: 650.0000 mg | Freq: Four times a day (QID) | RECTAL | Status: DC | PRN

## 2018-07-19 MED ORDER — UBROGEPANT 50 MG PO TABS: 100.0000 mg | ORAL_TABLET | Freq: Every day | ORAL | Status: DC | PRN

## 2018-07-19 MED ORDER — PROPOFOL 10 MG/ML IV BOLUS
INTRAVENOUS | Status: DC | PRN
  Administered 2018-07-19: 200 mg via INTRAVENOUS

## 2018-07-19 MED ORDER — ONDANSETRON HCL 4 MG/2ML IJ SOLN
4.0000 mg | Freq: Once | INTRAMUSCULAR | Status: AC | PRN
  Administered 2018-07-19: 4 mg via INTRAVENOUS
  Filled 2018-07-19: qty 2

## 2018-07-19 MED ORDER — SUCCINYLCHOLINE CHLORIDE 20 MG/ML IJ SOLN
INTRAMUSCULAR | Status: DC | PRN
  Administered 2018-07-19: 120 mg via INTRAVENOUS

## 2018-07-19 MED ORDER — ONDANSETRON HCL 4 MG/2ML IJ SOLN
INTRAMUSCULAR | Status: DC | PRN
  Administered 2018-07-19: 4 mg via INTRAVENOUS

## 2018-07-19 MED ORDER — BUTORPHANOL TARTRATE 10 MG/ML NA SOLN
1.0000 | NASAL | Status: DC | PRN
  Filled 2018-07-19: qty 2.5

## 2018-07-19 MED ORDER — LACTATED RINGERS IV SOLN: INTRAVENOUS | Status: DC | PRN

## 2018-07-19 MED ORDER — FENTANYL CITRATE (PF) 100 MCG/2ML IJ SOLN
25.0000 ug | INTRAMUSCULAR | Status: DC | PRN
  Administered 2018-07-19 (×4): 50 ug via INTRAVENOUS

## 2018-07-19 MED ORDER — QUETIAPINE FUMARATE 25 MG PO TABS
75.0000 mg | ORAL_TABLET | Freq: Every day | ORAL | Status: DC
  Administered 2018-07-19: 22:00:00 75 mg via ORAL
  Filled 2018-07-19: qty 3

## 2018-07-19 MED ORDER — CITALOPRAM HYDROBROMIDE 20 MG PO TABS
40.0000 mg | ORAL_TABLET | Freq: Every day | ORAL | Status: DC
  Administered 2018-07-20: 09:00:00 40 mg via ORAL
  Filled 2018-07-19: qty 2

## 2018-07-19 MED ORDER — PROMETHAZINE HCL 25 MG/ML IJ SOLN
12.5000 mg | Freq: Once | INTRAMUSCULAR | Status: AC
  Administered 2018-07-19: 21:00:00 12.5 mg via INTRAVENOUS

## 2018-07-19 MED ORDER — SODIUM CHLORIDE 0.9 % IV BOLUS
1000.0000 mL | Freq: Once | INTRAVENOUS | Status: AC
  Administered 2018-07-19: 13:00:00 1000 mL via INTRAVENOUS

## 2018-07-19 MED ORDER — BUTALBITAL-APAP-CAFFEINE 50-325-40 MG PO TABS: 1.0000 | ORAL_TABLET | ORAL | Status: DC | PRN

## 2018-07-19 MED ORDER — SODIUM CHLORIDE FLUSH 0.9 % IV SOLN
INTRAVENOUS | Status: AC
  Filled 2018-07-19: qty 10

## 2018-07-19 MED ORDER — ACETAMINOPHEN 10 MG/ML IV SOLN
INTRAVENOUS | Status: DC | PRN
  Administered 2018-07-19: 1000 mg via INTRAVENOUS

## 2018-07-19 MED ORDER — FLUTICASONE PROPIONATE 50 MCG/ACT NA SUSP
2.0000 | Freq: Every day | NASAL | Status: DC
  Administered 2018-07-20: 09:00:00 2 via NASAL
  Filled 2018-07-19: qty 16

## 2018-07-19 MED ORDER — OXYCODONE HCL 5 MG PO TABS: 5.0000 mg | ORAL_TABLET | Freq: Once | ORAL | Status: DC | PRN

## 2018-07-19 MED ORDER — MIDAZOLAM HCL 2 MG/2ML IJ SOLN
INTRAMUSCULAR | Status: DC | PRN
  Administered 2018-07-19: 2 mg via INTRAVENOUS

## 2018-07-19 MED ORDER — PANTOPRAZOLE SODIUM 40 MG IV SOLR
40.0000 mg | Freq: Every day | INTRAVENOUS | Status: DC
  Administered 2018-07-19: 40 mg via INTRAVENOUS
  Filled 2018-07-19: qty 40

## 2018-07-19 MED ORDER — IRBESARTAN 75 MG PO TABS
37.5000 mg | ORAL_TABLET | Freq: Every day | ORAL | Status: DC
  Administered 2018-07-20: 09:00:00 37.5 mg via ORAL
  Filled 2018-07-19: qty 0.5

## 2018-07-19 MED ORDER — DIAZEPAM 5 MG PO TABS: 5.0000 mg | ORAL_TABLET | Freq: Three times a day (TID) | ORAL | Status: DC | PRN

## 2018-07-19 MED ORDER — CIPROFLOXACIN IN D5W 400 MG/200ML IV SOLN
400.0000 mg | Freq: Two times a day (BID) | INTRAVENOUS | Status: DC
  Administered 2018-07-20: 06:00:00 400 mg via INTRAVENOUS
  Filled 2018-07-19 (×2): qty 200

## 2018-07-19 MED ORDER — ROCURONIUM BROMIDE 100 MG/10ML IV SOLN
INTRAVENOUS | Status: DC | PRN
  Administered 2018-07-19: 50 mg via INTRAVENOUS
  Administered 2018-07-19: 20 mg via INTRAVENOUS

## 2018-07-19 MED ORDER — CEFAZOLIN SODIUM-DEXTROSE 2-3 GM-%(50ML) IV SOLR
INTRAVENOUS | Status: DC | PRN
  Administered 2018-07-19: 2 g via INTRAVENOUS

## 2018-07-19 MED ORDER — EPINEPHRINE 0.3 MG/0.3ML IJ SOAJ
0.3000 mg | INTRAMUSCULAR | Status: DC | PRN
  Filled 2018-07-19: qty 0.3

## 2018-07-19 MED ORDER — OXYCODONE HCL 5 MG/5ML PO SOLN: 5.0000 mg | Freq: Once | ORAL | Status: DC | PRN

## 2018-07-19 MED ORDER — SUGAMMADEX SODIUM 500 MG/5ML IV SOLN
INTRAVENOUS | Status: DC | PRN
  Administered 2018-07-19: 140 mg via INTRAVENOUS

## 2018-07-19 MED ORDER — TIZANIDINE HCL 4 MG PO TABS
4.0000 mg | ORAL_TABLET | Freq: Two times a day (BID) | ORAL | Status: DC | PRN
  Administered 2018-07-20: 09:00:00 4 mg via ORAL
  Filled 2018-07-19 (×2): qty 1

## 2018-07-19 MED ORDER — PREGABALIN 75 MG PO CAPS
150.0000 mg | ORAL_CAPSULE | Freq: Three times a day (TID) | ORAL | Status: DC
  Filled 2018-07-19 (×2): qty 2

## 2018-07-19 MED ORDER — SODIUM CHLORIDE 0.9 % IV SOLN
INTRAVENOUS | Status: DC | PRN
  Administered 2018-07-19: 18:00:00 via INTRAVENOUS

## 2018-07-19 MED ORDER — ARIPIPRAZOLE 5 MG PO TABS
5.0000 mg | ORAL_TABLET | Freq: Every day | ORAL | Status: DC
  Administered 2018-07-20: 5 mg via ORAL
  Filled 2018-07-19: qty 1

## 2018-07-19 MED ORDER — HYDROMORPHONE HCL 1 MG/ML IJ SOLN
1.0000 mg | Freq: Once | INTRAMUSCULAR | Status: AC
  Administered 2018-07-19: 15:00:00 1 mg via INTRAVENOUS
  Filled 2018-07-19: qty 1

## 2018-07-19 MED ORDER — PROMETHAZINE HCL 25 MG/ML IJ SOLN
INTRAMUSCULAR | Status: AC
  Administered 2018-07-19: 21:00:00 12.5 mg via INTRAVENOUS
  Filled 2018-07-19: qty 1

## 2018-07-19 MED ORDER — ACETAMINOPHEN 325 MG PO TABS: 650.0000 mg | ORAL_TABLET | Freq: Four times a day (QID) | ORAL | Status: DC | PRN

## 2018-07-19 MED ORDER — FENTANYL CITRATE (PF) 100 MCG/2ML IJ SOLN
INTRAMUSCULAR | Status: AC
  Filled 2018-07-19: qty 2

## 2018-07-19 SURGICAL SUPPLY — 39 items
APPLIER CLIP 5 13 M/L LIGAMAX5 (MISCELLANEOUS) ×3
BLADE SURG SZ11 CARB STEEL (BLADE) ×3 IMPLANT
CANISTER SUCT 1200ML W/VALVE (MISCELLANEOUS) ×3 IMPLANT
CATH CHOLANG 76X19 KUMAR (CATHETERS) ×3 IMPLANT
CHLORAPREP W/TINT 26 (MISCELLANEOUS) ×3 IMPLANT
CLIP APPLIE 5 13 M/L LIGAMAX5 (MISCELLANEOUS) ×1 IMPLANT
COVER WAND RF STERILE (DRAPES) ×3 IMPLANT
DERMABOND ADVANCED (GAUZE/BANDAGES/DRESSINGS) ×2
DERMABOND ADVANCED .7 DNX12 (GAUZE/BANDAGES/DRESSINGS) ×1 IMPLANT
ELECT REM PT RETURN 9FT ADLT (ELECTROSURGICAL) ×3
ELECTRODE REM PT RTRN 9FT ADLT (ELECTROSURGICAL) ×1 IMPLANT
GLOVE BIO SURGEON STRL SZ 6.5 (GLOVE) ×2 IMPLANT
GLOVE BIO SURGEONS STRL SZ 6.5 (GLOVE) ×1
GLOVE INDICATOR 6.5 STRL GRN (GLOVE) ×3 IMPLANT
GOWN STRL REUS W/ TWL LRG LVL3 (GOWN DISPOSABLE) ×4 IMPLANT
GOWN STRL REUS W/TWL LRG LVL3 (GOWN DISPOSABLE) ×8
GRASPER SUT TROCAR 14GX15 (MISCELLANEOUS) ×2 IMPLANT
HEMOSTAT SURGICEL 2X3 (HEMOSTASIS) IMPLANT
IRRIGATION STRYKERFLOW (MISCELLANEOUS) ×1 IMPLANT
IRRIGATOR STRYKERFLOW (MISCELLANEOUS) ×3
IV NS 1000ML (IV SOLUTION) ×2
IV NS 1000ML BAXH (IV SOLUTION) ×1 IMPLANT
KIT TURNOVER KIT A (KITS) ×3 IMPLANT
LABEL OR SOLS (LABEL) ×3 IMPLANT
NDL HYPO 25X1 1.5 SAFETY (NEEDLE) ×1 IMPLANT
NDL INSUFFLATION 14GA 120MM (NEEDLE) ×1 IMPLANT
NEEDLE HYPO 25X1 1.5 SAFETY (NEEDLE) ×3 IMPLANT
NEEDLE INSUFFLATION 14GA 120MM (NEEDLE) ×3 IMPLANT
NS IRRIG 500ML POUR BTL (IV SOLUTION) ×3 IMPLANT
PACK LAP CHOLECYSTECTOMY (MISCELLANEOUS) ×3 IMPLANT
POUCH SPECIMEN RETRIEVAL 10MM (ENDOMECHANICALS) ×3 IMPLANT
SCISSORS METZENBAUM CVD 33 (INSTRUMENTS) ×3 IMPLANT
SET TUBE SMOKE EVAC HIGH FLOW (TUBING) ×3 IMPLANT
SLEEVE ENDOPATH XCEL 5M (ENDOMECHANICALS) ×6 IMPLANT
SUT MNCRL AB 4-0 PS2 18 (SUTURE) ×3 IMPLANT
SUT VIC AB 0 CT1 36 (SUTURE) ×2 IMPLANT
SUT VICRYL 0 AB UR-6 (SUTURE) ×3 IMPLANT
TROCAR XCEL NON-BLD 11X100MML (ENDOMECHANICALS) ×3 IMPLANT
TROCAR XCEL NON-BLD 5MMX100MML (ENDOMECHANICALS) ×3 IMPLANT

## 2018-07-19 NOTE — Anesthesia Post-op Follow-up Note (Signed)
Anesthesia QCDR form completed.        

## 2018-07-19 NOTE — ED Notes (Signed)
See triage note  Presents with n/v  States he thinks it was something he ate    began vomiting at 2 am  Has tried Zofran at home with sips of clear liquids  W/o success.Carl Solis

## 2018-07-19 NOTE — Transfer of Care (Signed)
Immediate Anesthesia Transfer of Care Note  Patient: Carl Solis  Procedure(s) Performed: LAPAROSCOPIC CHOLECYSTECTOMY (N/A Abdomen)  Patient Location: PACU  Anesthesia Type:General  Level of Consciousness: awake, alert , oriented and patient cooperative  Airway & Oxygen Therapy: Patient Spontanous Breathing and Patient connected to face mask oxygen  Post-op Assessment: Report given to RN and Post -op Vital signs reviewed and stable  Post vital signs: Reviewed and stable  Last Vitals:  Vitals Value Taken Time  BP    Temp    Pulse    Resp    SpO2      Last Pain:  Vitals:   07/19/18 1714  TempSrc: Oral  PainSc: 5          Complications: No apparent anesthesia complications

## 2018-07-19 NOTE — H&P (Signed)
SURGICAL CONSULTATION NOTE   HISTORY OF PRESENT ILLNESS (HPI):  33 y.o. male presented to [facility] ED for evaluation of abdominal pain since last night. Patient reports pain in the epigastric area woke him up and he was sent here.  The pain does not radiate to other part of the body.  Patient denies fever or chills.  Alleviating factor has been pain medication at the ED.  There is no identified aggravating factor.  This is the first admit the patient developed this type of pain.  At ED ultrasound was done showing inflammation of the gallbladder with morphine sign.  Patient also with leukocytosis of 17,000.  I personally reviewed the images and the labs.  Surgery is consulted by Dr. Jacqualine Code in this context for evaluation and management of acute cholecystitis.  PAST MEDICAL HISTORY (PMH):  Past Medical History:  Diagnosis Date  . Avascular necrosis ([place])   . Migraine   . Migraines      PAST SURGICAL HISTORY ([place]):  Past Surgical History:  Procedure Laterality Date  . ADENOIDECTOMY    . HIP SURGERY Left      MEDICATIONS:  Prior to Admission medications   Medication Sig Start Date End Date Taking? Authorizing Provider  Butalbital-APAP-Caffeine 50-300-40 MG CAPS Take 1-2 capsules by mouth as needed. At onset of miagraine 11/10/13   [provider]  butorphanol (STADOL) 10 MG/ML nasal spray  09/04/14   [provider]  candesartan (ATACAND) 8 MG tablet Take 1 tablet by mouth daily. 03/30/18 03/30/19  [provider]  citalopram (CELEXA) 40 MG tablet Take 40 mg by mouth daily.  03/29/15   [provider]  diazepam (VALIUM) 5 MG tablet Take 5 mg by mouth every 8 (eight) hours as needed.  12/30/14   [provider]  EPINEPHrine 0.3 mg/0.3 mL IJ SOAJ injection Inject 0.3 mLs into the skin as needed.    [provider]  fluticasone (FLONASE) 50 MCG/ACT nasal spray Place 2 sprays into both nostrils daily.  05/05/15   [provider]   HYDROmorphone (DILAUDID) 4 MG tablet Take 1 tablet (4 mg total) by mouth every 4 (four) hours as needed for up to 30 days for severe pain. 07/06/18 08/05/18  Molli Barrows, MD  LYRICA 150 MG capsule TK ONE C PO TID 04/17/15   [provider]  ondansetron (ZOFRAN ODT) 4 MG disintegrating tablet Take 1 tablet (4 mg total) by mouth every 8 (eight) hours as needed for nausea or vomiting. 11/22/17   Carrie Mew, MD  oxyCODONE (OXYCONTIN) 40 mg 12 hr tablet Take by mouth.    [provider]  Oxycodone HCl 10 MG TABS Take 10 mg by mouth every 4 (four) hours.  05/10/15   [provider]  oxycodone-acetaminophen (LYNOX) 10-300 MG tablet Take 1 tablet by mouth every 4 (four) hours as needed for pain. Reported on 05/30/2015    [provider]  pantoprazole (PROTONIX) 40 MG tablet Take by mouth. 03/01/15 02/29/16  [provider]  promethazine (PHENERGAN) 12.5 MG tablet Take by mouth.    [provider]  QUEtiapine (SEROQUEL) 25 MG tablet Take 25 mg by mouth at bedtime.    [provider]  tamsulosin (FLOMAX) 0.4 MG CAPS capsule Reported on 05/30/2015 12/07/13   [provider]     ALLERGIES:  Allergies  Allergen Reactions  . D.H.E. [Dihydroergotamine] Swelling and Anaphylaxis    Lack Jaw, Rash, Hives  . Latex Hives and Rash  . Amoxicillin Diarrhea  .  Bee Venom   . Dhea     Other reaction(s): Other (See Comments) "retain water in legs which prevents me from walking"  . Eletriptan     Other reaction(s): Other (See Comments) "lock jaw where I can't eat or drink"  . Pork-Derived Products     Other reaction(s): Headache  . Rizatriptan     Other reaction(s): Other (See Comments) Lock jaw, rash, hives  . Sulfa Antibiotics Diarrhea  . Sulfacetamide Sodium Diarrhea  . Sumatriptan Other (See Comments)    "lock jaw where I can't eat or drink"  . Toradol [Ketorolac Tromethamine]   . Triptans   . Aspartame And Phenylalanine      headaches  . Bacitracin-Neomycin-Polymyxin Hives and Rash  . Ketorolac Hives and Rash  . Neosporin [Neomycin-Bacitracin Zn-Polymyx] Rash     SOCIAL HISTORY:  Social History   Socioeconomic History  . Marital status: Married    Spouse name: Not on file  . Number of children: Not on file  . Years of education: Not on file  . Highest education level: Not on file  Occupational History  . Not on file  Social Needs  . Financial resource strain: Not on file  . Food insecurity    Worry: Not on file    Inability: Not on file  . Transportation needs    Medical: Not on file    Non-medical: Not on file  Tobacco Use  . Smoking status: Current Every Day Smoker    Packs/day: 0.10    Years: 10.00    Pack years: 1.00  . Smokeless tobacco: Never Used  Substance and Sexual Activity  . Alcohol use: Yes  . Drug use: No  . Sexual activity: Never  Lifestyle  . Physical activity    Days per week: Not on file    Minutes per session: Not on file  . Stress: Not on file  Relationships  . Social Herbalist on phone: Not on file    Gets together: Not on file    Attends religious service: Not on file    Active member of club or organization: Not on file    Attends meetings of clubs or organizations: Not on file    Relationship status: Not on file  . Intimate partner violence    Fear of current or ex partner: Not on file    Emotionally abused: Not on file    Physically abused: Not on file    Forced sexual activity: Not on file  Other Topics Concern  . Not on file  Social History Narrative  . Not on file    The patient currently resides (home / rehab facility / nursing home): Home The patient normally is (ambulatory / bedbound): Ambulatory   FAMILY HISTORY:  Family History  Problem Relation Age of Onset  . Brain cancer Father   . Depression Mother   . Anxiety disorder Mother      REVIEW OF SYSTEMS:  Constitutional: denies weight loss, fever, chills, or sweats  Eyes:  denies any other vision changes, history of eye injury  ENT: denies sore throat, hearing problems  Respiratory: denies shortness of breath, wheezing  Cardiovascular: denies chest pain, palpitations  Gastrointestinal: positive abdominal pain, Denies nausea and vomitnig Genitourinary: denies burning with urination or urinary frequency Musculoskeletal: denies any other joint pains or cramps  Skin: denies any other rashes or skin discolorations  Neurological: denies any other headache, dizziness, weakness  Psychiatric: denies any other depression, anxiety  All other review of systems were negative   VITAL SIGNS:  Temp:  [98.6 F (37 C)] 98.6 F (37 C) (06/16 1140) Pulse Rate:  [56-60] 60 (06/16 1419) Resp:  [18] 18 (06/16 1419) BP: (120-122)/(62-64) 120/64 (06/16 1419) SpO2:  [100 %] 100 % (06/16 1419) Weight:  [68 kg] 68 kg (06/16 1141)     Height: 5\' 10"  (177.8 cm) Weight: 68 kg BMI (Calculated): 21.52   INTAKE/OUTPUT:  This shift: No intake/output data recorded.  Last 2 shifts: @IOLAST2SHIFTS @   PHYSICAL EXAM:  Constitutional:  -- Normal body habitus  -- Awake, alert, and oriented x3  Eyes:  -- Pupils equally round and reactive to light  -- No scleral icterus  Ear, nose, and throat:  -- No jugular venous distension  Pulmonary:  -- No crackles  -- Equal breath sounds bilaterally -- Breathing non-labored at rest Cardiovascular:  -- S1, S2 present  -- No pericardial rubs Gastrointestinal:  -- Abdomen soft, tender to palpation in the right upper quadrant, non-distended, no guarding or rebound tenderness.  Positive Murphy sign -- No abdominal masses appreciated, pulsatile or otherwise  Musculoskeletal and Integumentary:  -- Wounds or skin discoloration: None appreciated -- Extremities: B/L UE and LE FROM, hands and feet warm, no edema  Neurologic:  -- Motor function: intact and symmetric -- Sensation: intact and symmetric   Labs:  CBC Latest Ref Rng & Units 07/19/2018  11/22/2017 08/29/2015  WBC 4.0 - 10.5 K/uL 17.1(H) 16.4(H) -  Hemoglobin 13.0 - 17.0 g/dL 15.5 15.3 -  Hematocrit 39.0 - 52.0 % 45.3 44.4 46.7  Platelets 150 - 400 K/uL 245 229 -   CMP Latest Ref Rng & Units 07/19/2018 11/22/2017 11/07/2013  Glucose 70 - 99 mg/dL 139(H) 166(H) 102(H)  BUN 6 - 20 mg/dL <5(L) 9 4(L)  Creatinine 0.61 - 1.24 mg/dL 1.18 1.29(H) 1.23  Sodium 135 - 145 mmol/L 143 142 140  Potassium 3.5 - 5.1 mmol/L 4.6 3.7 4.3  Chloride 98 - 111 mmol/L 101 104 106  CO2 22 - 32 mmol/L 28 28 31   Calcium 8.9 - 10.3 mg/dL 10.2 9.8 9.5  Total Protein 6.5 - 8.1 g/dL 7.1 7.6 7.4  Total Bilirubin 0.3 - 1.2 mg/dL 0.5 1.9(H) 0.6  Alkaline Phos 38 - 126 U/L 59 55 61  AST 15 - 41 U/L 16 41 10(L)  ALT 0 - 44 U/L 10 49(H) 15   Imaging studies:  EXAM: ULTRASOUND ABDOMEN LIMITED RIGHT UPPER QUADRANT  COMPARISON:  None.  FINDINGS: Gallbladder:  There is diffuse gallbladder wall thickening with the gallbladder wall measuring approximately 7 mm. The sonographic Percell Miller sign is positive. There is pericholecystic free fluid. Gallstones are noted measuring up to 1 cm.  Common bile duct:  Diameter: 0.3 cm.  Liver:  No focal lesion identified. Within normal limits in parenchymal echogenicity. Portal vein is patent on color Doppler imaging with normal direction of blood flow towards the liver.  IMPRESSION: Sonographic findings are consistent with acute calculus cholecystitis.   Electronically Signed   By: Constance Holster M.D.   On: 07/19/2018 15:22  Assessment/Plan:  33 y.o. male with acute cholecystitis, complicated by pertinent comorbidities including anxiety and migraines.  Patient with history, physical exam and images consistent with acute cholecystitis. Patient oriented about diagnosis and surgical versus antibiotic therapy management as treatments.  Patient has what was the best type of treatment for this type of condition.  I oriented the patient that the  standard of care is laparoscopic cholecystectomy.  IV antibiotics as an option but did recurrence rate is higher.  He agreed to proceed with cholecystectomy.  Discussed the risk of surgery including post-op infxn, seroma, biloma, chronic pain, poor-delayed wound healing, retained gallstone, conversion to open procedure, post-op SBO or ileus, and need for additional procedures to address said risks.  The risks of general anesthetic including MI, CVA, sudden death or even reaction to anesthetic medications also discussed. Alternatives include continued observation.  Benefits include possible symptom relief, prevention of complications including acute cholecystitis, pancreatitis.  Arnold Long, MD

## 2018-07-19 NOTE — ED Notes (Signed)
States he feels better   No vomiting since meds given

## 2018-07-19 NOTE — ED Triage Notes (Signed)
Pt states he thinks he ate "something bad" last night, around 2am starting having stabbing mid abd pain and vomiting, denies diarrhea. 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

## 2018-07-19 NOTE — Interval H&P Note (Signed)
History and Physical Interval Note:  07/19/2018 6:02 PM  Carl Solis  has presented today for surgery, with the diagnosis of acute cholecystitis.  The various methods of treatment have been discussed with the patient and family. After consideration of risks, benefits and other options for treatment, the patient has consented to  Procedure(s): LAPAROSCOPIC CHOLECYSTECTOMY (N/A) as a surgical intervention.  The patient's history has been reviewed, patient examined, no change in status, stable for surgery.  I have reviewed the patient's chart and labs.  Questions were answered to the patient's satisfaction.     Herbert Pun

## 2018-07-19 NOTE — ED Notes (Signed)
ED TO INPATIENT HANDOFF REPORT  ED Nurse Name and Phone #: Jacques Navy  [phone]   S Name/Age/Gender Carl Solis 33 y.o. male Room/Bed: ED41A/ED41A  Code Status   Code Status: Full Code  Home/SNF/Other Home Patient oriented to: self, place, time and situation Is this baseline? Yes   Triage Complete: Triage complete  Chief Complaint vomiting  Triage Note Pt states he thinks he ate "something bad" last night, around 2am starting having stabbing mid abd pain and vomiting, denies diarrhea. 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    Allergies Allergies  Allergen Reactions  . D.H.E. [Dihydroergotamine] Swelling and Anaphylaxis    Lack Jaw, Rash, Hives  . Latex Hives and Rash  . Amoxicillin Diarrhea  . Bee Venom   . Dhea     Other reaction(s): Other (See Comments) "retain water in legs which prevents me from walking"  . Eletriptan     Other reaction(s): Other (See Comments) "lock jaw where I can't eat or drink"  . Pork-Derived Products     Other reaction(s): Headache  . Rizatriptan     Other reaction(s): Other (See Comments) Lock jaw, rash, hives  . Sulfa Antibiotics Diarrhea  . Sulfacetamide Sodium Diarrhea  . Sumatriptan Other (See Comments)    "lock jaw where I can't eat or drink"  . Toradol [Ketorolac Tromethamine]   . Triptans   . Aspartame And Phenylalanine     headaches  . Bacitracin-Neomycin-Polymyxin Hives and Rash  . Ketorolac Hives and Rash  . Neosporin [Neomycin-Bacitracin Zn-Polymyx] Rash    Level of Care/Admitting Diagnosis ED Disposition    ED Disposition Condition [facility]: [place] [[id number]]  Level of Care: Med-Surg [16]  Covid Evaluation: Screening Protocol (No Symptoms)  Diagnosis: Acute cholecystitis [575.0.ICD-9-CM]  Admitting Physician: Herbert Pun [[id number]]  Attending  Physician: Herbert Pun [[id number]]  PT Class (Do Not Modify): Observation [104]  PT Acc Code (Do Not Modify): Observation [10022]       B Medical/Surgery History Past Medical History:  Diagnosis Date  . Avascular necrosis ([place])   . Migraine   . Migraines    Past Surgical History:  Procedure Laterality Date  . ADENOIDECTOMY    . HIP SURGERY Left      A IV Location/Drains/Wounds Patient Lines/Drains/Airways Status   Active Line/Drains/Airways    Name:   Placement date:   Placement time:   Site:   Days:   Peripheral IV 07/19/18 Right Antecubital   07/19/18    1156    Antecubital   less than 1          Intake/Output Last 24 hours No intake or output data in the 24 hours ending 07/19/18 1753  Labs/Imaging Results for orders placed or performed during the hospital encounter of 07/19/18 (from the past 48 hour(s))  Urinalysis, Complete w Microscopic     Status: Abnormal   Collection Time: 07/19/18 11:50 AM  Result Value Ref Range   Color, Urine YELLOW (A) YELLOW   APPearance CLEAR (A) CLEAR   Specific Gravity, Urine 1.023 1.005 - 1.030   pH 6.0 5.0 - 8.0   Glucose, UA NEGATIVE NEGATIVE mg/dL   Hgb urine dipstick NEGATIVE NEGATIVE   Bilirubin Urine NEGATIVE NEGATIVE   Ketones, ur NEGATIVE NEGATIVE mg/dL   Protein, ur 30 (A) NEGATIVE mg/dL   Nitrite NEGATIVE NEGATIVE  Leukocytes,Ua NEGATIVE NEGATIVE   RBC / HPF 0-5 0 - 5 RBC/hpf   WBC, UA 0-5 0 - 5 WBC/hpf   Bacteria, UA NONE SEEN NONE SEEN   Squamous Epithelial / LPF NONE SEEN 0 - 5   Mucus PRESENT     Comment: Performed at [facility], [address] [place], [place] [address]  Lipase, blood     Status: None   Collection Time: 07/19/18 11:57 AM  Result Value Ref Range   Lipase 21 11 - 51 U/L    Comment: Performed at [facility], [address] [place], [place] [address]  Comprehensive metabolic panel     Status: Abnormal   Collection Time: 07/19/18 11:57 AM  Result Value Ref  Range   Sodium 143 135 - 145 mmol/L   Potassium 4.6 3.5 - 5.1 mmol/L   Chloride 101 98 - 111 mmol/L   CO2 28 22 - 32 mmol/L   Glucose, Bld 139 (H) 70 - 99 mg/dL   BUN <5 (L) 6 - 20 mg/dL   Creatinine, Ser 1.18 0.61 - 1.24 mg/dL   Calcium 10.2 8.9 - 10.3 mg/dL   Total Protein 7.1 6.5 - 8.1 g/dL   Albumin 4.6 3.5 - 5.0 g/dL   AST 16 15 - 41 U/L   ALT 10 0 - 44 U/L   Alkaline Phosphatase 59 38 - 126 U/L   Total Bilirubin 0.5 0.3 - 1.2 mg/dL   GFR calc non Af Amer >60 >60 mL/min   GFR calc Af Amer >60 >60 mL/min   Anion gap 14 5 - 15    Comment: Performed at [facility], [address] [place], [place] [address]  CBC     Status: Abnormal   Collection Time: 07/19/18 11:57 AM  Result Value Ref Range   WBC 17.1 (H) 4.0 - 10.5 K/uL   RBC 5.31 4.22 - 5.81 MIL/uL   Hemoglobin 15.5 13.0 - 17.0 g/dL   HCT 45.3 39.0 - 52.0 %   MCV 85.3 80.0 - 100.0 fL   MCH 29.2 26.0 - 34.0 pg   MCHC 34.2 30.0 - 36.0 g/dL   RDW 13.9 11.5 - 15.5 %   Platelets 245 150 - 400 K/uL   nRBC 0.0 0.0 - 0.2 %    Comment: Performed at [facility], [address], [place], [place] [address]  SARS Coronavirus 2 (CEPHEID - Performed in [place] hospital lab), Hosp Order     Status: None   Collection Time: 07/19/18  4:09 PM   Specimen: Urine, Clean Catch; Nasopharyngeal  Result Value Ref Range   SARS Coronavirus 2 NEGATIVE NEGATIVE    Comment: (NOTE) If result is NEGATIVE SARS-CoV-2 target nucleic acids are NOT DETECTED. The SARS-CoV-2 RNA is generally detectable in upper and lower  respiratory specimens during the acute phase of infection. The lowest  concentration of SARS-CoV-2 viral copies this assay can detect is 250  copies / mL. A negative result does not preclude SARS-CoV-2 infection  and should not be used as the sole basis for treatment or other  patient management decisions.  A negative result may occur with  improper specimen collection / handling, submission of specimen other   than nasopharyngeal swab, presence of viral mutation(s) within the  areas targeted by this assay, and inadequate number of viral copies  (<250 copies / mL). A negative result must be combined with clinical  observations, patient history, and epidemiological information. If result is POSITIVE SARS-CoV-2  target nucleic acids are DETECTED. The SARS-CoV-2 RNA is generally detectable in upper and lower  respiratory specimens dur ing the acute phase of infection.  Positive  results are indicative of active infection with SARS-CoV-2.  Clinical  correlation with patient history and other diagnostic information is  necessary to determine patient infection status.  Positive results do  not rule out bacterial infection or co-infection with other viruses. If result is PRESUMPTIVE POSTIVE SARS-CoV-2 nucleic acids MAY BE PRESENT.   A presumptive positive result was obtained on the submitted specimen  and confirmed on repeat testing.  While 2019 novel coronavirus  (SARS-CoV-2) nucleic acids may be present in the submitted sample  additional confirmatory testing may be necessary for epidemiological  and / or clinical management purposes  to differentiate between  SARS-CoV-2 and other Sarbecovirus currently known to infect humans.  If clinically indicated additional testing with an alternate test  methodology [phone]) is advised. The SARS-CoV-2 RNA is generally  detectable in upper and lower respiratory sp ecimens during the acute  phase of infection. The expected result is Negative. Fact Sheet for Patients:  StrictlyIdeas.no Fact Sheet for Healthcare Providers: BankingDealers.co.za This test is not yet approved or cleared by the [place] FDA and has been authorized for detection and/or diagnosis of SARS-CoV-2 by FDA under an Emergency Use Authorization (EUA).  This EUA will remain in effect (meaning this test can be used) for the duration of  the COVID-19 declaration under Section 564(b)(1) of the Act, 21 U.S.C. section 360bbb-3(b)(1), unless the authorization is terminated or revoked sooner. Performed at [facility], [address] [place], [place] [address]    Dg Abdomen 1 View  Result Date: 07/19/2018 CLINICAL DATA:  Abdominal pain, nausea, and vomiting since 0200 hours EXAM: ABDOMEN - 1 VIEW COMPARISON:  06/09/2016 FINDINGS: Normal bowel gas pattern. No bowel dilatation or bowel wall thickening. BILATERAL hip prostheses. Bones otherwise unremarkable. Tiny radiopacities project over the RIGHT kidney question bowel artifacts versus tiny calculi. BILATERAL pelvic phleboliths. IMPRESSION: Normal bowel gas pattern. Tiny radiopacities projecting over RIGHT kidney question bowel artifacts versus tiny calculi. Electronically Signed   By: Lavonia Dana M.D.   On: 07/19/2018 14:07   US Abdomen Limited Ruq  Result Date: 07/19/2018 CLINICAL DATA:  Right upper quadrant abdominal pain. EXAM: ULTRASOUND ABDOMEN LIMITED RIGHT UPPER QUADRANT COMPARISON:  None. FINDINGS: Gallbladder: There is diffuse gallbladder wall thickening with the gallbladder wall measuring approximately 7 mm. The sonographic Percell Miller sign is positive. There is pericholecystic free fluid. Gallstones are noted measuring up to 1 cm. Common bile duct: Diameter: 0.3 cm. Liver: No focal lesion identified. Within normal limits in parenchymal echogenicity. Portal vein is patent on color Doppler imaging with normal direction of blood flow towards the liver. IMPRESSION: Sonographic findings are consistent with acute calculus cholecystitis. Electronically Signed   By: Constance Holster M.D.   On: 07/19/2018 15:22    Pending Labs Unresulted Labs (From admission, onward)    Start     Ordered   07/19/18 1651  HIV antibody (Routine Testing)  Once,   STAT     07/19/18 1650          Vitals/Pain Today's Vitals   07/19/18 1419 07/19/18 1419 07/19/18 1613 07/19/18 1714  BP:   120/64  120/60  Pulse:  60  62  Resp:  18  18  Temp:    98.7 F (37.1 C)  TempSrc:    Oral  SpO2:  100%  100%  Weight:  Height:      PainSc: 5   4  5      Isolation Precautions No active isolations  Medications Medications  butalbital-acetaminophen-caffeine (FIORICET) 50-325-40 MG per tablet 1-2 tablet (has no administration in time range)  butorphanol (STADOL) nasal spray 1 spray (has no administration in time range)  irbesartan (AVAPRO) tablet 37.5 mg (has no administration in time range)  EPINEPHrine (EPI-PEN) injection 0.3 mg (has no administration in time range)  citalopram (CELEXA) tablet 40 mg (has no administration in time range)  diazepam (VALIUM) tablet 5 mg (has no administration in time range)  QUEtiapine (SEROQUEL) tablet 25 mg (has no administration in time range)  pregabalin (LYRICA) capsule 150 mg (has no administration in time range)  fluticasone (FLONASE) 50 MCG/ACT nasal spray 2 spray (has no administration in time range)  enoxaparin (LOVENOX) injection 40 mg (has no administration in time range)  ciprofloxacin (CIPRO) IVPB 400 mg (has no administration in time range)    And  metroNIDAZOLE (FLAGYL) IVPB 500 mg (has no administration in time range)  acetaminophen (TYLENOL) tablet 650 mg (has no administration in time range)    Or  acetaminophen (TYLENOL) suppository 650 mg (has no administration in time range)  HYDROcodone-acetaminophen (NORCO/VICODIN) 5-325 MG per tablet 1-2 tablet (has no administration in time range)  morphine 4 MG/ML injection 4 mg (4 mg Intravenous Given 07/19/18 1729)  promethazine (PHENERGAN) injection 25 mg (has no administration in time range)  pantoprazole (PROTONIX) injection 40 mg (has no administration in time range)  sodium chloride flush (NS) 0.9 % injection 3 mL (3 mLs Intravenous Given by Other 07/19/18 1218)  ondansetron (ZOFRAN) injection 4 mg (4 mg Intravenous Given 07/19/18 1201)  sodium chloride 0.9 % bolus 1,000 mL (0  mLs Intravenous Stopped 07/19/18 1419)  promethazine (PHENERGAN) injection 25 mg (25 mg Intravenous Given 07/19/18 1250)  HYDROmorphone (DILAUDID) injection 1 mg (1 mg Intravenous Given 07/19/18 1249)  HYDROmorphone (DILAUDID) injection 1 mg (1 mg Intravenous Given 07/19/18 1506)    Mobility walks Low fall risk   Focused Assessments    R Recommendations: See Admitting Provider Note  Report given to:  Olean Ree RN  Additional Notes:

## 2018-07-19 NOTE — Anesthesia Preprocedure Evaluation (Signed)
Anesthesia Evaluation  Patient identified by MRN, date of birth, ID band Patient awake    Reviewed: Allergy & Precautions, H&P , NPO status , Patient's Chart, lab work & pertinent test results  History of Anesthesia Complications Negative for: history of anesthetic complications  Airway Mallampati: I  TM Distance: >3 FB Neck ROM: full    Dental  (+) Chipped, Poor Dentition, Missing, Edentulous Upper   Pulmonary neg shortness of breath, Current Smoker,           Cardiovascular Exercise Tolerance: Good (-) angina(-) Past MI and (-) DOE negative cardio ROS       Neuro/Psych  Headaches, PSYCHIATRIC DISORDERS    GI/Hepatic negative GI ROS, Neg liver ROS, neg GERD  ,  Endo/Other  negative endocrine ROS  Renal/GU      Musculoskeletal   Abdominal   Peds  Hematology negative hematology ROS (+)   Anesthesia Other Findings Past Medical History: No date: Avascular necrosis (HCC) No date: Migraine No date: Migraines  Past Surgical History: No date: ADENOIDECTOMY No date: HIP SURGERY; Left  BMI    Body Mass Index: 21.52 kg/m      Reproductive/Obstetrics negative OB ROS                             Anesthesia Physical Anesthesia Plan  ASA: III  Anesthesia Plan: General ETT   Post-op Pain Management:    Induction: Intravenous  PONV Risk Score and Plan: Ondansetron, Dexamethasone, Midazolam and Treatment may vary due to age or medical condition  Airway Management Planned: Oral ETT  Additional Equipment:   Intra-op Plan:   Post-operative Plan: Extubation in OR  Informed Consent: I have reviewed the patients History and Physical, chart, labs and discussed the procedure including the risks, benefits and alternatives for the proposed anesthesia with the patient or authorized representative who has indicated his/her understanding and acceptance.     Dental Advisory Given  Plan  Discussed with: Anesthesiologist, CRNA and Surgeon  Anesthesia Plan Comments: (Patient consented for risks of anesthesia including but not limited to:  - adverse reactions to medications - damage to teeth, lips or other oral mucosa - sore throat or hoarseness - Damage to heart, brain, lungs or loss of life  Patient voiced understanding.)        Anesthesia Quick Evaluation

## 2018-07-19 NOTE — ED Provider Notes (Signed)
Carl Solis  ____________________________________________   First MD Initiated Contact with Patient 07/19/18 1219     (approximate)  I have reviewed the triage vital signs and the nursing notes.   HISTORY  Chief Complaint Emesis and Abdominal Pain    HPI Carl Solis is a 33 y.o. male presents emergency department complaint of abdominal pain.  He woke in the middle of the night with abdominal pain and started vomiting.  Had a lot of nausea.  Tried to sip clear liquids without success.  States the Zofran has only helped a small amount.  States the abdominal pain is stabbing in the epigastric area.  Patient still has a gallbladder and appendix.  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  Has had bilateral hip replacements and is pending bilateral shoulder replacements.  He takes Dilaudid 4 mg p.o., [facility] prescriber website shows that he gets his medication on a regular basis from Dr. Andree Elk.    Past Medical History:  Diagnosis Date   Avascular necrosis ([place])    Migraine    Migraines     Patient Active Problem List   Diagnosis Date Noted   Avascular necrosis of bone ([place]) 02/26/2015   Anxiety disorder 07/25/2014   Headache, migraine 03/13/2014   Cephalalgia 09/05/2013   Disordered sleep 09/05/2013   Current tobacco use 09/05/2013    Past Surgical History:  Procedure Laterality Date   ADENOIDECTOMY     HIP SURGERY Left     Prior to Admission medications   Medication Sig Start Date End Date Taking? Authorizing Provider  Butalbital-APAP-Caffeine 50-300-40 MG CAPS Take 1-2 capsules by mouth as needed. At onset of miagraine 11/10/13   [provider]  butorphanol (STADOL) 10 MG/ML nasal spray  09/04/14   [provider]  candesartan (ATACAND) 8 MG tablet Take 1 tablet by mouth daily. 03/30/18 03/30/19  [provider]  citalopram (CELEXA) 40 MG  tablet Take 40 mg by mouth daily.  03/29/15   [provider]  diazepam (VALIUM) 5 MG tablet Take 5 mg by mouth every 8 (eight) hours as needed.  12/30/14   [provider]  EPINEPHrine 0.3 mg/0.3 mL IJ SOAJ injection Inject 0.3 mLs into the skin as needed.    [provider]  fluticasone (FLONASE) 50 MCG/ACT nasal spray Place 2 sprays into both nostrils daily.  05/05/15   [provider]  HYDROmorphone (DILAUDID) 4 MG tablet Take 1 tablet (4 mg total) by mouth every 4 (four) hours as needed for up to 30 days for severe pain. 07/06/18 08/05/18  Molli Barrows, MD  LYRICA 150 MG capsule TK ONE C PO TID 04/17/15   [provider]  ondansetron (ZOFRAN ODT) 4 MG disintegrating tablet Take 1 tablet (4 mg total) by mouth every 8 (eight) hours as needed for nausea or vomiting. 11/22/17   Carrie Mew, MD  oxyCODONE (OXYCONTIN) 40 mg 12 hr tablet Take by mouth.    [provider]  Oxycodone HCl 10 MG TABS Take 10 mg by mouth every 4 (four) hours.  05/10/15   [provider]  oxycodone-acetaminophen (LYNOX) 10-300 MG tablet Take 1 tablet by mouth every 4 (four) hours as needed for pain. Reported on 05/30/2015    [provider]  pantoprazole (PROTONIX) 40 MG tablet Take by mouth. 03/01/15 02/29/16  [provider]  promethazine (PHENERGAN) 12.5 MG tablet Take by mouth.    [provider]  QUEtiapine (SEROQUEL) 25 MG tablet Take 25 mg by mouth at bedtime.    [provider]  tamsulosin (FLOMAX) 0.4 MG CAPS capsule Reported on 05/30/2015 12/07/13   [provider]    Allergies D.h.e. [dihydroergotamine], Latex, Amoxicillin, Bee venom, Dhea, Eletriptan, Pork-derived products, Rizatriptan, Sulfa antibiotics, Sulfacetamide sodium, Sumatriptan, Toradol [ketorolac tromethamine], Triptans, Aspartame and phenylalanine, Bacitracin-neomycin-polymyxin, Ketorolac, and Neosporin [neomycin-bacitracin zn-polymyx]  Family  History  Problem Relation Age of Onset   Brain cancer Father    Depression Mother    Anxiety disorder Mother     Social History Social History   Tobacco Use   Smoking status: Current Every Day Smoker    Packs/day: 0.10    Years: 10.00    Pack years: 1.00   Smokeless tobacco: Never Used  Substance Use Topics   Alcohol use: Yes   Drug use: No    Review of Systems  Constitutional: No fever/chills Eyes: No visual changes. ENT: No sore throat. Respiratory: Denies cough Gastrointestinal: Positive for abdominal pain with nausea and vomiting, last bowel movement was yesterday Genitourinary: Negative for dysuria. Musculoskeletal: Negative for back pain. Skin: Negative for rash.    ____________________________________________   PHYSICAL EXAM:  VITAL SIGNS: ED Triage Vitals  Enc Vitals Group     BP 07/19/18 1140 122/62     Pulse Rate 07/19/18 1140 (!) 56     Resp --      Temp 07/19/18 1140 98.6 F (37 C)     Temp Source 07/19/18 1140 Oral     SpO2 07/19/18 1140 100 %     Weight 07/19/18 1141 150 lb (68 kg)     Height 07/19/18 1141 _0  (1.778 m)     Head Circumference --      Peak Flow --      Pain Score 07/19/18 1148 10     Pain Loc --      Pain Edu? --      Excl. in [place]? --     Constitutional: Alert and oriented. Well appearing and in no acute distress. Eyes: Conjunctivae are normal.  Head: Atraumatic. Nose: No congestion/rhinnorhea. Mouth/Throat: Mucous membranes are moist.   Neck:  supple no lymphadenopathy noted Cardiovascular: Normal rate, regular rhythm. Heart sounds are normal Respiratory: Normal respiratory effort.  No retractions, lungs c t a  Abd: soft tender right upper quadrant and midepigastric Bs normal all 4 quad GU: deferred Musculoskeletal: FROM all extremities, warm and well perfused Neurologic:  Normal speech and language.  Skin:  Skin is warm, dry and intact. No rash noted. Psychiatric: Mood and affect are normal. Speech and  behavior are normal.  ____________________________________________   LABS (all labs ordered are listed, but only abnormal results are displayed)  Labs Reviewed  COMPREHENSIVE METABOLIC PANEL - Abnormal; Notable for the following components:      Result Value   Glucose, Bld 139 (*)    BUN <5 (*)    All other components within normal limits  CBC - Abnormal; Notable for the following components:   WBC 17.1 (*)    All other components within normal limits  SARS CORONAVIRUS 2 (HOSPITAL ORDER, [place] [facility])  LIPASE, BLOOD  URINALYSIS, COMPLETE (UACMP) WITH MICROSCOPIC   ____________________________________________   ____________________________________________  RADIOLOGY  Ultrasound of the right upper quadrant shows acute cholecystitis  ____________________________________________   PROCEDURES  Procedure(s) performed: Saline lock, Dilaudid 1 mg, a repeat of Dilaudid 1 mg, normal saline 1 L  Procedures  ____________________________________________   INITIAL IMPRESSION / ASSESSMENT AND PLAN / ED COURSE  Pertinent labs & imaging results that were available during my care of the patient were reviewed by me and considered in my medical decision making (see chart for details).   Patient is 33 year old male presents emergency department complaining of abdominal pain along with nausea and vomiting.  He denies fever or chills.  Physical exam shows patient to look nauseated, right upper quadrant tenderness, positive Murphy sign.  KUB is negative Ultrasound right upper quadrant shows acute cholecystitis  CBC has elevated WBC of 17.1, comprehensive metabolic panel has increased glucose at 139, lipase is normal, urinalysis still pending and COVID-19 test is pending  Dr. Jacqualine Code in to  see the patient.  He instructed me to page surgery for evaluation.  Page Dr. Peyton Najjar.  He is coming to the ED to see the patient.    Carl Solis was evaluated in  Emergency Department on 07/19/2018 for the symptoms described in the history of present illness. He was evaluated in the context of the global COVID-19 pandemic, which necessitated consideration that the patient might be at risk for infection with the SARS-CoV-2 virus that causes COVID-19. Institutional protocols and algorithms that pertain to the evaluation of patients at risk for COVID-19 are in a state of rapid change based on information released by regulatory bodies including the CDC and federal and state organizations. These policies and algorithms were followed during the patient's care in the ED.   As part of my medical decision making, I reviewed the following data within the [facility] notes reviewed and incorporated, Labs reviewed CBC has elevated WBC at 17, met base normal, UA pending, COVID pending, lipase normal, Old chart reviewed, Radiograph reviewed ultrasound shows acute cholecystitis, KUB negative, A consult was requested and obtained from this/these consultant(s) Surgery, Evaluated by EM attending Dr Jacqualine Code, Notes from prior ED visits and [place] Controlled Substance Database  ____________________________________________   FINAL CLINICAL IMPRESSION(S) / ED DIAGNOSES  Final diagnoses:  RUQ pain  Acute cholecystitis      NEW MEDICATIONS STARTED DURING THIS VISIT:  New Prescriptions   No medications on file     [facility]:  This document was prepared using Dragon voice recognition software and may include unintentional dictation errors.    Versie Starks, PA-C 07/19/18 1614    Delman Kitten, MD 07/19/18 [phone]

## 2018-07-19 NOTE — ED Provider Notes (Signed)
Medical screening examination/treatment/procedure(s) were conducted as a shared visit with non-physician practitioner(s) and myself.  I personally evaluated the patient during the encounter.    ----------------------------------------- 4:02 PM on 07/19/2018 -----------------------------------------  Patient moderately tender focally in the right upper quadrant still.  Reports pain about 7 out of 10 slightly better after medication.  Reviewed ultrasound findings, also elevated white count.  Consultation being placed to general surgery.  Patient denies any known exposure to anyone known to have coronavirus and is not having cough cold or COVID-like symptoms, but given a possibility he may need surgery a rapid COVID will be sent.  Consult to general surgery   Delman Kitten, MD 07/19/18 2140

## 2018-07-19 NOTE — Anesthesia Postprocedure Evaluation (Signed)
Anesthesia Post Note  Patient: Carl Solis  Procedure(s) Performed: LAPAROSCOPIC CHOLECYSTECTOMY (N/A Abdomen)  Patient location during evaluation: PACU Anesthesia Type: General Level of consciousness: awake and alert Pain management: pain level controlled Vital Signs Assessment: post-procedure vital signs reviewed and stable Respiratory status: spontaneous breathing, nonlabored ventilation, respiratory function stable and patient connected to nasal cannula oxygen Cardiovascular status: blood pressure returned to baseline and stable Postop Assessment: no apparent nausea or vomiting Anesthetic complications: no     Last Vitals:  Vitals:   07/19/18 2107 07/19/18 2115  BP: 122/74   Pulse: 90 87  Resp: 12 14  Temp:    SpO2: 100% 99%    Last Pain:  Vitals:   07/19/18 2115  TempSrc:   PainSc: 5                  Precious Haws Lynzie Cliburn

## 2018-07-19 NOTE — Op Note (Signed)
Preoperative diagnosis: Acute cholecystitis.  Postoperative diagnosis: Acute cholecystitis.  Procedure: Laparoscopic Cholecystectomy.   Anesthesia: GETA   Surgeon: Dr. Windell Moment  Wound Classification: Clean Contaminated  Indications: Patient is a 33 y.o. male developed epigastric pain, nausea and leukocytosis and on workup was found to have cholelithiasis with a normal common duct and signs of cholecystitis. Laparoscopic cholecystectomy was elected.  Findings: Severe peri cholecystic edema Critical view of safety achieved Cystic duct and artery identified, ligated and divided Adequate hemostasis  Description of procedure: The patient was placed on the operating table in the supine position. General anesthesia was induced. A time-out was completed verifying correct patient, procedure, site, positioning, and implant(s) and/or special equipment prior to beginning this procedure. An orogastric tube was placed. The abdomen was prepped and draped in the usual sterile fashion.  An incision was made in a natural skin line above the umbilicus.  The fascia was elevated and the Veress needle inserted. Proper position was confirmed by aspiration and saline meniscus test.  The abdomen was insufflated with carbon dioxide to a pressure of 15 mmHg. The patient tolerated insufflation well. A 11-mm trocar was then inserted.  The laparoscope was inserted and the abdomen inspected. No injuries from initial trocar placement were noted. Additional trocars were then inserted in the following locations: a 5-mm trocar in the right epigastrium and two 5-mm trocars along the right costal margin. The abdomen was inspected and no abnormalities were found. The table was placed in the reverse Trendelenburg position with the right side up.  Filmy adhesions between the gallbladder and omentum, duodenum and transverse colon were lysed sharply. The dome of the gallbladder was grasped with an atraumatic grasper passed  through the lateral port and retracted over the dome of the liver. The infundibulum was also grasped with an atraumatic grasper through the midclavicular port and retracted toward the right lower quadrant. This maneuver exposed Calot's triangle. The peritoneum overlying the gallbladder infundibulum was then incised and the cystic duct and cystic artery identified and circumferentially dissected. Critical view of safety reviewed before ligating any structure. The cystic duct and cystic artery were then doubly clipped and divided close to the gallbladder.  The gallbladder was then dissected from its peritoneal attachments by electrocautery. Hemostasis was checked and the gallbladder and contained stones were removed using an endoscopic retrieval bag placed through the umbilical port. The gallbladder was passed off the table as a specimen. The gallbladder fossa was copiously irrigated with saline and hemostasis was obtained. There was no evidence of bleeding from the gallbladder fossa or cystic artery or leakage of the bile from the cystic duct stump. Secondary trocars were removed under direct vision. No bleeding was noted. The laparoscope was withdrawn and the umbilical trocar removed. The abdomen was allowed to collapse. The fascia of the 79mm trocar sites was closed with figure-of-eight 0 vicryl sutures. The skin was closed with subcuticular sutures of 4-0 monocryl and topical skin adhesive. The orogastric tube was removed.  The patient tolerated the procedure well and was taken to the postanesthesia care unit in stable condition.   Specimen: Gallbladder  Complications: None  EBL: 25 mL

## 2018-07-19 NOTE — ED Notes (Signed)
ED TO INPATIENT HANDOFF REPORT  ED Nurse Name and Phone #:  Vaughan Basta [phone]  S Name/Age/Gender Carl Solis 33 y.o. male Room/Bed: ED41A/ED41A  Code Status   Code Status: Full Code  Home/SNF/Other Home Patient oriented to: self, place, time and situation Is this baseline? Yes   Triage Complete: Triage complete  Chief Complaint vomiting  Triage Note Pt states he thinks he ate "something bad" last night, around 2am starting having stabbing mid abd pain and vomiting, denies diarrhea. 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    Allergies Allergies  Allergen Reactions  . D.H.E. [Dihydroergotamine] Swelling and Anaphylaxis    Lack Jaw, Rash, Hives  . Latex Hives and Rash  . Amoxicillin Diarrhea  . Bee Venom   . Dhea     Other reaction(s): Other (See Comments) "retain water in legs which prevents me from walking"  . Eletriptan     Other reaction(s): Other (See Comments) "lock jaw where I can't eat or drink"  . Pork-Derived Products     Other reaction(s): Headache  . Rizatriptan     Other reaction(s): Other (See Comments) Lock jaw, rash, hives  . Sulfa Antibiotics Diarrhea  . Sulfacetamide Sodium Diarrhea  . Sumatriptan Other (See Comments)    "lock jaw where I can't eat or drink"  . Toradol [Ketorolac Tromethamine]   . Triptans   . Aspartame And Phenylalanine     headaches  . Bacitracin-Neomycin-Polymyxin Hives and Rash  . Ketorolac Hives and Rash  . Neosporin [Neomycin-Bacitracin Zn-Polymyx] Rash    Level of Care/Admitting Diagnosis ED Disposition    ED Disposition Condition [facility] [place] [[id number]]  Level of Care: Med-Surg [16]  Covid Evaluation: Screening Protocol (No Symptoms)  Diagnosis: Acute cholecystitis [575.0.ICD-9-CM]  Admitting Physician: Herbert Pun [[id number]]  Attending Physician:  Herbert Pun [[id number]]  PT Class (Do Not Modify): Observation [104]  PT Acc Code (Do Not Modify): Observation [10022]       B Medical/Surgery History Past Medical History:  Diagnosis Date  . Avascular necrosis ([place])   . Migraine   . Migraines    Past Surgical History:  Procedure Laterality Date  . ADENOIDECTOMY    . HIP SURGERY Left      A IV Location/Drains/Wounds Patient Lines/Drains/Airways Status   Active Line/Drains/Airways    Name:   Placement date:   Placement time:   Site:   Days:   Peripheral IV 07/19/18 Right Antecubital   07/19/18    1156    Antecubital   less than 1          Intake/Output Last 24 hours No intake or output data in the 24 hours ending 07/19/18 1725  Labs/Imaging Results for orders placed or performed during the hospital encounter of 07/19/18 (from the past 48 hour(s))  Urinalysis, Complete w Microscopic     Status: Abnormal   Collection Time: 07/19/18 11:50 AM  Result Value Ref Range   Color, Urine YELLOW (A) YELLOW   APPearance CLEAR (A) CLEAR   Specific Gravity, Urine 1.023 1.005 - 1.030   pH 6.0 5.0 - 8.0   Glucose, UA NEGATIVE NEGATIVE mg/dL   Hgb urine dipstick NEGATIVE NEGATIVE   Bilirubin Urine NEGATIVE NEGATIVE   Ketones, ur NEGATIVE NEGATIVE mg/dL   Protein, ur 30 (A) NEGATIVE mg/dL   Nitrite NEGATIVE NEGATIVE   Leukocytes,Ua NEGATIVE NEGATIVE  RBC / HPF 0-5 0 - 5 RBC/hpf   WBC, UA 0-5 0 - 5 WBC/hpf   Bacteria, UA NONE SEEN NONE SEEN   Squamous Epithelial / LPF NONE SEEN 0 - 5   Mucus PRESENT     Comment: Performed at [facility], [address] [place], [place] [address]  Lipase, blood     Status: None   Collection Time: 07/19/18 11:57 AM  Result Value Ref Range   Lipase 21 11 - 51 U/L    Comment: Performed at [facility], [address] [place], [place] [address]  Comprehensive metabolic panel     Status: Abnormal   Collection Time: 07/19/18 11:57 AM  Result Value Ref Range    Sodium 143 135 - 145 mmol/L   Potassium 4.6 3.5 - 5.1 mmol/L   Chloride 101 98 - 111 mmol/L   CO2 28 22 - 32 mmol/L   Glucose, Bld 139 (H) 70 - 99 mg/dL   BUN <5 (L) 6 - 20 mg/dL   Creatinine, Ser 1.18 0.61 - 1.24 mg/dL   Calcium 10.2 8.9 - 10.3 mg/dL   Total Protein 7.1 6.5 - 8.1 g/dL   Albumin 4.6 3.5 - 5.0 g/dL   AST 16 15 - 41 U/L   ALT 10 0 - 44 U/L   Alkaline Phosphatase 59 38 - 126 U/L   Total Bilirubin 0.5 0.3 - 1.2 mg/dL   GFR calc non Af Amer >60 >60 mL/min   GFR calc Af Amer >60 >60 mL/min   Anion gap 14 5 - 15    Comment: Performed at [facility], [address] [place], [place] [address]  CBC     Status: Abnormal   Collection Time: 07/19/18 11:57 AM  Result Value Ref Range   WBC 17.1 (H) 4.0 - 10.5 K/uL   RBC 5.31 4.22 - 5.81 MIL/uL   Hemoglobin 15.5 13.0 - 17.0 g/dL   HCT 45.3 39.0 - 52.0 %   MCV 85.3 80.0 - 100.0 fL   MCH 29.2 26.0 - 34.0 pg   MCHC 34.2 30.0 - 36.0 g/dL   RDW 13.9 11.5 - 15.5 %   Platelets 245 150 - 400 K/uL   nRBC 0.0 0.0 - 0.2 %    Comment: Performed at [facility], [address] [place], [place] [address]   Dg Abdomen 1 View  Result Date: 07/19/2018 CLINICAL DATA:  Abdominal pain, nausea, and vomiting since 0200 hours EXAM: ABDOMEN - 1 VIEW COMPARISON:  06/09/2016 FINDINGS: Normal bowel gas pattern. No bowel dilatation or bowel wall thickening. BILATERAL hip prostheses. Bones otherwise unremarkable. Tiny radiopacities project over the RIGHT kidney question bowel artifacts versus tiny calculi. BILATERAL pelvic phleboliths. IMPRESSION: Normal bowel gas pattern. Tiny radiopacities projecting over RIGHT kidney question bowel artifacts versus tiny calculi. Electronically Signed   By: Lavonia Dana M.D.   On: 07/19/2018 14:07   US Abdomen Limited Ruq  Result Date: 07/19/2018 CLINICAL DATA:  Right upper quadrant abdominal pain. EXAM: ULTRASOUND ABDOMEN LIMITED RIGHT UPPER QUADRANT COMPARISON:  None. FINDINGS: Gallbladder: There  is diffuse gallbladder wall thickening with the gallbladder wall measuring approximately 7 mm. The sonographic Percell Miller sign is positive. There is pericholecystic free fluid. Gallstones are noted measuring up to 1 cm. Common bile duct: Diameter: 0.3 cm. Liver: No focal lesion identified. Within normal limits in parenchymal echogenicity. Portal vein is patent on color Doppler imaging with normal direction of blood flow towards the liver. IMPRESSION: Sonographic findings are  consistent with acute calculus cholecystitis. Electronically Signed   By: Constance Holster M.D.   On: 07/19/2018 15:22    Pending Labs Unresulted Labs (From admission, onward)    Start     Ordered   07/19/18 1651  HIV antibody (Routine Testing)  Once,   STAT     07/19/18 1650   07/19/18 1609  SARS Coronavirus 2 (CEPHEID - Performed in [place] hospital lab), Hosp Order  (Asymptomatic Patients Labs)  Once,   STAT    Question:  Rule Out  Answer:  Yes   07/19/18 1608          Vitals/Pain Today's Vitals   07/19/18 1419 07/19/18 1419 07/19/18 1613 07/19/18 1714  BP:  120/64  120/60  Pulse:  60  62  Resp:  18  18  Temp:    98.7 F (37.1 C)  TempSrc:    Oral  SpO2:  100%  100%  Weight:      Height:      PainSc: 5   4  5      Isolation Precautions No active isolations  Medications Medications  Butalbital-APAP-Caffeine 50-300-40 MG CAPS 1-2 capsule (has no administration in time range)  butorphanol (STADOL) nasal spray 1 spray (has no administration in time range)  irbesartan (AVAPRO) tablet 37.5 mg (has no administration in time range)  EPINEPHrine (EPI-PEN) injection 0.3 mg (has no administration in time range)  citalopram (CELEXA) tablet 40 mg (has no administration in time range)  diazepam (VALIUM) tablet 5 mg (has no administration in time range)  QUEtiapine (SEROQUEL) tablet 25 mg (has no administration in time range)  pregabalin (LYRICA) capsule 150 mg (has no administration in time range)  fluticasone  (FLONASE) 50 MCG/ACT nasal spray 2 spray (has no administration in time range)  enoxaparin (LOVENOX) injection 40 mg (has no administration in time range)  ciprofloxacin (CIPRO) IVPB 400 mg (has no administration in time range)    And  metroNIDAZOLE (FLAGYL) IVPB 500 mg (has no administration in time range)  acetaminophen (TYLENOL) tablet 650 mg (has no administration in time range)    Or  acetaminophen (TYLENOL) suppository 650 mg (has no administration in time range)  HYDROcodone-acetaminophen (NORCO/VICODIN) 5-325 MG per tablet 1-2 tablet (has no administration in time range)  morphine 4 MG/ML injection 4 mg (has no administration in time range)  promethazine (PHENERGAN) injection 25 mg (has no administration in time range)  pantoprazole (PROTONIX) injection 40 mg (has no administration in time range)  sodium chloride flush (NS) 0.9 % injection 3 mL (3 mLs Intravenous Given by Other 07/19/18 1218)  ondansetron (ZOFRAN) injection 4 mg (4 mg Intravenous Given 07/19/18 1201)  sodium chloride 0.9 % bolus 1,000 mL (0 mLs Intravenous Stopped 07/19/18 1419)  promethazine (PHENERGAN) injection 25 mg (25 mg Intravenous Given 07/19/18 1250)  HYDROmorphone (DILAUDID) injection 1 mg (1 mg Intravenous Given 07/19/18 1249)  HYDROmorphone (DILAUDID) injection 1 mg (1 mg Intravenous Given 07/19/18 1506)    Mobility walks Low fall risk   Focused Assessments Cardiac Assessment Handoff:    No results found for: CKTOTAL, CKMB, CKMBINDEX, TROPONINI No results found for: DDIMER Does the Patient currently have chest pain? No   , Pulmonary Assessment Handoff:  Lung sounds:  clear bil O2 Device: Room Air   Op consent signed. Pt to go to OR around or after 1900 today per OR charge nurse.      R Recommendations: See Admitting Provider Note  Report given to:   Additional Notes:

## 2018-07-19 NOTE — ED Notes (Signed)
Admitting MD in with pt    

## 2018-07-19 NOTE — Anesthesia Procedure Notes (Signed)
Procedure Name: Intubation Date/Time: 07/19/2018 6:32 PM Performed by: Caryl Asp, CRNA Pre-anesthesia Checklist: Patient identified, Emergency Drugs available, Suction available, Patient being monitored and Timeout performed Patient Re-evaluated:Patient Re-evaluated prior to induction Oxygen Delivery Method: Circle system utilized Preoxygenation: Pre-oxygenation with 100% oxygen Induction Type: IV induction and Rapid sequence Laryngoscope Size: Mac and 3 Grade View: Grade I Tube size: 7.5 mm Number of attempts: 1 Placement Confirmation: ETT inserted through vocal cords under direct vision,  positive ETCO2 and breath sounds checked- equal and bilateral Secured at: 22 cm Tube secured with: Tape Dental Injury: Teeth and Oropharynx as per pre-operative assessment

## 2018-07-20 ENCOUNTER — Encounter: Payer: Self-pay | Admitting: General Surgery

## 2018-07-20 ENCOUNTER — Encounter: Payer: Self-pay | Admitting: Anesthesiology

## 2018-07-20 MED ORDER — HYDROMORPHONE HCL 2 MG PO TABS: 4.0000 mg | ORAL_TABLET | Freq: Four times a day (QID) | ORAL | Status: DC | PRN

## 2018-07-20 MED ORDER — HYDROMORPHONE HCL 4 MG PO TABS: 4.0000 mg | ORAL_TABLET | ORAL | 0 refills | Status: DC | PRN

## 2018-07-20 MED ORDER — PROMETHAZINE HCL 25 MG/ML IJ SOLN
15.0000 mg | Freq: Once | INTRAMUSCULAR | Status: AC
  Administered 2018-07-20: 15 mg via INTRAVENOUS
  Filled 2018-07-20: qty 1

## 2018-07-20 NOTE — Discharge Summary (Signed)
Patient ID: Dreshon Proffit MRN: [id number] DOB/AGE: September 28, 1985 33 y.o.  Admit date: 07/19/2018 Discharge date: 07/20/2018   Discharge Diagnoses:  Active Problems:   Acute cholecystitis   Procedures: Laparoscopic cholecystectomy  Hospital Course: Patient admitted with acute cholecystitis.  He underwent laparoscopic cholecystectomy.  He tolerated the procedure well.  This morning patient is tolerating breakfast and pain under control.  No problem with wounds.  Physical Exam  Constitutional: He is well-developed, well-nourished, and in no distress.  Cardiovascular: Normal rate and regular rhythm.  Pulmonary/Chest: Effort normal.  Abdominal: Soft. Bowel sounds are normal. He exhibits no distension. There is no abdominal tenderness.  Wounds are dry and clean   Consults: None   Disposition: Discharge disposition: 01-Home or Self Care       Discharge Instructions    Diet - low sodium heart healthy   Complete by: As directed      Allergies as of 07/20/2018      Reactions   D.h.e. [dihydroergotamine] Swelling, Anaphylaxis   Lack Jaw, Rash, Hives   Latex Hives, Rash   Amoxicillin Diarrhea   Bee Venom    Dhea    Other reaction(s): Other (See Comments) "retain water in legs which prevents me from walking"   Eletriptan    Other reaction(s): Other (See Comments) "lock jaw where I can't eat or drink"   Pork-derived Products    Other reaction(s): Headache   Rizatriptan    Other reaction(s): Other (See Comments) Lock jaw, rash, hives   Sulfa Antibiotics Diarrhea   Sulfacetamide Sodium Diarrhea   Sumatriptan Other (See Comments)   "lock jaw where I can't eat or drink"   Toradol [ketorolac Tromethamine]    Triptans    Aspartame And Phenylalanine    headaches   Bacitracin-neomycin-polymyxin Hives, Rash   Ketorolac Hives, Rash   Neosporin [neomycin-bacitracin Zn-polymyx] Rash      Medication List    TAKE these medications   ARIPiprazole 5 MG tablet Commonly known  as: ABILIFY Take 5 mg by mouth daily.   Butalbital-APAP-Caffeine 50-300-40 MG Caps Take 1-2 capsules by mouth every 4 (four) hours as needed (migraine).   candesartan 8 MG tablet Commonly known as: ATACAND Take 16 mg by mouth daily.   citalopram 40 MG tablet Commonly known as: CELEXA Take 40 mg by mouth daily.   diazepam 5 MG tablet Commonly known as: VALIUM Take 5 mg by mouth every 8 (eight) hours as needed for anxiety.   EPINEPHrine 0.3 mg/0.3 mL Soaj injection Commonly known as: EPI-PEN Inject 0.3 mLs into the skin as needed for anaphylaxis.   fluticasone 50 MCG/ACT nasal spray Commonly known as: FLONASE Place 2 sprays into both nostrils daily.   HYDROmorphone 4 MG tablet Commonly known as: Dilaudid Take 1 tablet (4 mg total) by mouth every 4 (four) hours as needed for up to 30 days for severe pain. What changed: Another medication with the same name was added. Make sure you understand how and when to take each.   HYDROmorphone 4 MG tablet Commonly known as: Dilaudid Take 1 tablet (4 mg total) by mouth every 4 (four) hours as needed for severe pain. What changed: You were already taking a medication with the same name, and this prescription was added. Make sure you understand how and when to take each.   ondansetron 4 MG disintegrating tablet Commonly known as: Zofran ODT Take 1 tablet (4 mg total) by mouth every 8 (eight) hours as needed for nausea or vomiting.   pantoprazole 40 MG  tablet Commonly known as: PROTONIX Take 40 mg by mouth 2 (two) times daily.   QUEtiapine 25 MG tablet Commonly known as: SEROQUEL Take 75 mg by mouth See admin instructions. Take 3 tablets (75mg ) by mouth at night as needed to reestablish sleep pattern   QUEtiapine 100 MG tablet Commonly known as: SEROQUEL Take 100 mg by mouth at bedtime as needed (sleep).   tiZANidine 4 MG tablet Commonly known as: ZANAFLEX Take 4 mg by mouth 2 (two) times daily as needed for muscle spasms.    Ubrelvy 50 MG Tabs Generic drug: Ubrogepant Take 100 mg by mouth daily as needed (migraine).      Follow-up Information    Herbert Pun, MD Follow up in 2 week(s).   Specialty: General Surgery Contact information: [address] [place] [address] [phone]

## 2018-07-20 NOTE — Progress Notes (Signed)
Nsg Discharge Note  Admit Date:  07/19/2018 Discharge date: 07/20/2018   Carl Solis to be D/C'd Home per MD order.  AVS completed.  Copy for chart, and copy for patient signed, and dated. Patient/caregiver able to verbalize understanding.  Discharge Medication: Allergies as of 07/20/2018      Reactions   D.h.e. [dihydroergotamine] Swelling, Anaphylaxis   Lack Jaw, Rash, Hives   Latex Hives, Rash   Amoxicillin Diarrhea   Bee Venom    Dhea    Other reaction(s): Other (See Comments) "retain water in legs which prevents me from walking"   Eletriptan    Other reaction(s): Other (See Comments) "lock jaw where I can't eat or drink"   Pork-derived Products    Other reaction(s): Headache   Rizatriptan    Other reaction(s): Other (See Comments) Lock jaw, rash, hives   Sulfa Antibiotics Diarrhea   Sulfacetamide Sodium Diarrhea   Sumatriptan Other (See Comments)   "lock jaw where I can't eat or drink"   Toradol [ketorolac Tromethamine]    Triptans    Aspartame And Phenylalanine    headaches   Bacitracin-neomycin-polymyxin Hives, Rash   Ketorolac Hives, Rash   Neosporin [neomycin-bacitracin Zn-polymyx] Rash      Medication List    TAKE these medications   ARIPiprazole 5 MG tablet Commonly known as: ABILIFY Take 5 mg by mouth daily.   Butalbital-APAP-Caffeine 50-300-40 MG Caps Take 1-2 capsules by mouth every 4 (four) hours as needed (migraine).   candesartan 8 MG tablet Commonly known as: ATACAND Take 16 mg by mouth daily.   citalopram 40 MG tablet Commonly known as: CELEXA Take 40 mg by mouth daily.   diazepam 5 MG tablet Commonly known as: VALIUM Take 5 mg by mouth every 8 (eight) hours as needed for anxiety.   EPINEPHrine 0.3 mg/0.3 mL Soaj injection Commonly known as: EPI-PEN Inject 0.3 mLs into the skin as needed for anaphylaxis.   fluticasone 50 MCG/ACT nasal spray Commonly known as: FLONASE Place 2 sprays into both nostrils daily.   HYDROmorphone 4 MG  tablet Commonly known as: Dilaudid Take 1 tablet (4 mg total) by mouth every 4 (four) hours as needed for up to 30 days for severe pain. What changed: Another medication with the same name was added. Make sure you understand how and when to take each.   HYDROmorphone 4 MG tablet Commonly known as: Dilaudid Take 1 tablet (4 mg total) by mouth every 4 (four) hours as needed for severe pain. What changed: You were already taking a medication with the same name, and this prescription was added. Make sure you understand how and when to take each.   ondansetron 4 MG disintegrating tablet Commonly known as: Zofran ODT Take 1 tablet (4 mg total) by mouth every 8 (eight) hours as needed for nausea or vomiting.   pantoprazole 40 MG tablet Commonly known as: PROTONIX Take 40 mg by mouth 2 (two) times daily.   QUEtiapine 25 MG tablet Commonly known as: SEROQUEL Take 75 mg by mouth See admin instructions. Take 3 tablets (75mg ) by mouth at night as needed to reestablish sleep pattern   QUEtiapine 100 MG tablet Commonly known as: SEROQUEL Take 100 mg by mouth at bedtime as needed (sleep).   tiZANidine 4 MG tablet Commonly known as: ZANAFLEX Take 4 mg by mouth 2 (two) times daily as needed for muscle spasms.   Ubrelvy 50 MG Tabs Generic drug: Ubrogepant Take 100 mg by mouth daily as needed (migraine).  Discharge Assessment: Vitals:   07/20/18 0543 07/20/18 1136  BP: 118/74 107/68  Pulse: 88 85  Resp: 16 17  Temp: 99.2 F (37.3 C) 97.9 F (36.6 C)  SpO2: 97% 100%   Skin clean, dry and intact without evidence of skin break down, no evidence of skin tears noted. IV catheter discontinued intact. Site without signs and symptoms of complications - no redness or edema noted at insertion site, patient denies c/o pain - only slight tenderness at site.  Dressing with slight pressure applied.  D/c Instructions-Education: Discharge instructions given to patient/family with verbalized  understanding. D/c education completed with patient/family including follow up instructions, medication list, d/c activities limitations if indicated, with other d/c instructions as indicated by MD - patient able to verbalize understanding, all questions fully answered. Patient instructed to return to ED, call 911, or call MD for any changes in condition.  Patient escorted via [place], and D/C home via private auto.  Eda Keys, RN 07/20/2018 1:31 PM

## 2018-07-20 NOTE — Discharge Instructions (Signed)

## 2018-07-21 LAB — HIV ANTIBODY (ROUTINE TESTING W REFLEX): HIV Screen 4th Generation wRfx: NONREACTIVE

## 2018-07-21 LAB — SURGICAL PATHOLOGY

## 2018-07-22 ENCOUNTER — Encounter: Payer: Self-pay | Admitting: Anesthesiology

## 2018-08-02 ENCOUNTER — Encounter: Payer: Self-pay | Admitting: Anesthesiology

## 2018-08-02 ENCOUNTER — Ambulatory Visit: Payer: BC Managed Care – PPO | Attending: Anesthesiology | Admitting: Anesthesiology

## 2018-08-02 ENCOUNTER — Other Ambulatory Visit: Payer: Self-pay

## 2018-08-02 DIAGNOSIS — G894 Chronic pain syndrome: Secondary | ICD-10-CM

## 2018-08-02 DIAGNOSIS — M8700 Idiopathic aseptic necrosis of unspecified bone: Secondary | ICD-10-CM

## 2018-08-02 DIAGNOSIS — M25551 Pain in right hip: Secondary | ICD-10-CM | POA: Diagnosis not present

## 2018-08-02 DIAGNOSIS — F1190 Opioid use, unspecified, uncomplicated: Secondary | ICD-10-CM | POA: Insufficient documentation

## 2018-08-02 DIAGNOSIS — M25552 Pain in left hip: Secondary | ICD-10-CM

## 2018-08-02 HISTORY — DX: Chronic pain syndrome: G89.4

## 2018-08-02 HISTORY — DX: Opioid use, unspecified, uncomplicated: F11.90

## 2018-08-02 HISTORY — DX: Idiopathic aseptic necrosis of unspecified bone: M87.00

## 2018-08-02 HISTORY — DX: Pain in right hip: M25.551

## 2018-08-02 MED ORDER — HYDROMORPHONE HCL 4 MG PO TABS: 4.0000 mg | ORAL_TABLET | ORAL | 0 refills | Status: AC | PRN

## 2018-08-02 NOTE — Progress Notes (Signed)
Virtual Visit via Video Note  I connected with Carl Solis on 08/02/18 at  1:15 PM EDT by a video enabled telemedicine application and verified that I am speaking with the correct person using two identifiers.  Location: Patient: Home Provider: Control center   I discussed the limitations of evaluation and management by telemedicine and the availability of in person appointments. The patient expressed understanding and agreed to proceed.  History of Present Illness: I spoke via video virtual conferencing to Carl Solis today.  He has been doing well in regards to his chronic pain complex and states that the nature of the pain is about the same.  It is still well-tolerated when he is taking his Dilaudid 4-5 times per day.  He denies any change in the severity of pain and his upper or lower extremity function have remained at baseline.  Bowel bladder function is good.  Based on our discussion today he is continued to derive good functional lifestyle improvement with the medication whereas more conservative therapy had him in extreme pain.    Observations/Objective: Current Outpatient Medications:  .  ARIPiprazole (ABILIFY) 5 MG tablet, Take 5 mg by mouth daily., Disp: , Rfl:  .  Butalbital-APAP-Caffeine 50-300-40 MG CAPS, Take 1-2 capsules by mouth every 4 (four) hours as needed (migraine). , Disp: , Rfl:  .  candesartan (ATACAND) 8 MG tablet, Take 16 mg by mouth daily. , Disp: , Rfl:  .  citalopram (CELEXA) 40 MG tablet, Take 40 mg by mouth daily. , Disp: , Rfl:  .  diazepam (VALIUM) 5 MG tablet, Take 5 mg by mouth every 8 (eight) hours as needed for anxiety. , Disp: , Rfl:  .  EPINEPHrine 0.3 mg/0.3 mL IJ SOAJ injection, Inject 0.3 mLs into the skin as needed for anaphylaxis. , Disp: , Rfl:  .  fluticasone (FLONASE) 50 MCG/ACT nasal spray, Place 2 sprays into both nostrils daily. , Disp: , Rfl:  .  HYDROmorphone (DILAUDID) 4 MG tablet, Take 1 tablet (4 mg total) by mouth every 4 (four) hours  as needed for severe pain., Disp: 8 tablet, Rfl: 0 .  [START ON 08/05/2018] HYDROmorphone (DILAUDID) 4 MG tablet, Take 1 tablet (4 mg total) by mouth every 4 (four) hours as needed for severe pain., Disp: 150 tablet, Rfl: 0 .  ondansetron (ZOFRAN ODT) 4 MG disintegrating tablet, Take 1 tablet (4 mg total) by mouth every 8 (eight) hours as needed for nausea or vomiting., Disp: 20 tablet, Rfl: 0 .  pantoprazole (PROTONIX) 40 MG tablet, Take 40 mg by mouth 2 (two) times daily. , Disp: , Rfl:  .  QUEtiapine (SEROQUEL) 100 MG tablet, Take 100 mg by mouth at bedtime as needed (sleep)., Disp: , Rfl:  .  QUEtiapine (SEROQUEL) 25 MG tablet, Take 75 mg by mouth See admin instructions. Take 3 tablets (75mg ) by mouth at night as needed to reestablish sleep pattern, Disp: , Rfl:  .  tiZANidine (ZANAFLEX) 4 MG tablet, Take 4 mg by mouth 2 (two) times daily as needed for muscle spasms., Disp: , Rfl:  .  Ubrogepant (UBRELVY) 50 MG TABS, Take 100 mg by mouth daily as needed (migraine)., Disp: , Rfl:    Assessment and Plan: 1. Avascular necrosis ([place])   2. Hip pain, bilateral   3. Chronic, continuous use of opioids   4. Chronic pain syndrome   Based on our discussion today I am going to refill his medication for July 3.  This will be for Dilaudid 5  mg tablets to be taken every 4-6 hours up to 5 times per day.  I schedule him for return to clinic in 1 month.  I have reviewed the [facility] practitioner database information and it is appropriate.  He is to continue follow-up with his primary care physicians for his baseline medical care.   Follow Up Instructions:    I discussed the assessment and treatment plan with the patient. The patient was provided an opportunity to ask questions and all were answered. The patient agreed with the plan and demonstrated an understanding of the instructions.   The patient was advised to call back or seek an in-person evaluation if the symptoms worsen or if the condition fails  to improve as anticipated.  I provided 30 minutes of non-face-to-face time during this encounter.   Molli Barrows, MD

## 2018-08-13 ENCOUNTER — Encounter: Payer: Self-pay | Admitting: Anesthesiology

## 2018-09-05 ENCOUNTER — Encounter: Payer: Self-pay | Admitting: Anesthesiology

## 2018-09-05 ENCOUNTER — Ambulatory Visit: Payer: BC Managed Care – PPO | Attending: Anesthesiology | Admitting: Anesthesiology

## 2018-09-05 ENCOUNTER — Other Ambulatory Visit: Payer: Self-pay

## 2018-09-05 DIAGNOSIS — M25552 Pain in left hip: Secondary | ICD-10-CM

## 2018-09-05 DIAGNOSIS — M25551 Pain in right hip: Secondary | ICD-10-CM | POA: Diagnosis not present

## 2018-09-05 DIAGNOSIS — F1190 Opioid use, unspecified, uncomplicated: Secondary | ICD-10-CM

## 2018-09-05 DIAGNOSIS — M19011 Primary osteoarthritis, right shoulder: Secondary | ICD-10-CM

## 2018-09-05 DIAGNOSIS — G894 Chronic pain syndrome: Secondary | ICD-10-CM

## 2018-09-05 DIAGNOSIS — G43111 Migraine with aura, intractable, with status migrainosus: Secondary | ICD-10-CM

## 2018-09-05 DIAGNOSIS — F411 Generalized anxiety disorder: Secondary | ICD-10-CM

## 2018-09-05 DIAGNOSIS — G44221 Chronic tension-type headache, intractable: Secondary | ICD-10-CM

## 2018-09-05 DIAGNOSIS — M8700 Idiopathic aseptic necrosis of unspecified bone: Secondary | ICD-10-CM

## 2018-09-05 DIAGNOSIS — M19012 Primary osteoarthritis, left shoulder: Secondary | ICD-10-CM

## 2018-09-05 MED ORDER — HYDROMORPHONE HCL 4 MG PO TABS: 4.0000 mg | ORAL_TABLET | ORAL | 0 refills | Status: DC | PRN

## 2018-09-05 NOTE — Progress Notes (Signed)
Virtual Visit via Video Note  I connected with Carl Solis on 09/05/18 at  2:15 PM EDT by a video enabled telemedicine application and verified that I am speaking with the correct person using two identifiers.  Location: Patient: Home Provider: Pain control center   I discussed the limitations of evaluation and management by telemedicine and the availability of in person appointments. The patient expressed understanding and agreed to proceed.  History of Present Illness: I spoke with Carl Solis via video voice conferencing today.  Her last evaluation was a month ago and has been doing well.  His shoulder pain and hip pain is been stable.  He is taking his Dilaudid as prescribed and this continues to work well for him keeping his pain under good control.  He reports no side effects.  He still uses Valium on a as needed basis and does not utilize these at the same time as his opioid.  Otherwise no change in quality characteristic or distribution of his pain is noted and is been stable otherwise.  His strength is been at baseline as well.   Observations/Objective:  Current Outpatient Medications:  .  ARIPiprazole (ABILIFY) 5 MG tablet, Take 5 mg by mouth daily., Disp: , Rfl:  .  Butalbital-APAP-Caffeine 50-300-40 MG CAPS, Take 1-2 capsules by mouth every 4 (four) hours as needed (migraine). , Disp: , Rfl:  .  candesartan (ATACAND) 8 MG tablet, Take 16 mg by mouth daily. , Disp: , Rfl:  .  citalopram (CELEXA) 40 MG tablet, Take 40 mg by mouth daily. , Disp: , Rfl:  .  diazepam (VALIUM) 5 MG tablet, Take 5 mg by mouth every 8 (eight) hours as needed for anxiety. , Disp: , Rfl:  .  EPINEPHrine 0.3 mg/0.3 mL IJ SOAJ injection, Inject 0.3 mLs into the skin as needed for anaphylaxis. , Disp: , Rfl:  .  fluticasone (FLONASE) 50 MCG/ACT nasal spray, Place 2 sprays into both nostrils daily. , Disp: , Rfl:  .  HYDROmorphone (DILAUDID) 4 MG tablet, Take 1 tablet (4 mg total) by mouth every 4 (four)  hours as needed for severe pain., Disp: 150 tablet, Rfl: 0 .  ondansetron (ZOFRAN ODT) 4 MG disintegrating tablet, Take 1 tablet (4 mg total) by mouth every 8 (eight) hours as needed for nausea or vomiting., Disp: 20 tablet, Rfl: 0 .  pantoprazole (PROTONIX) 40 MG tablet, Take 40 mg by mouth 2 (two) times daily. , Disp: , Rfl:  .  QUEtiapine (SEROQUEL) 100 MG tablet, Take 100 mg by mouth at bedtime as needed (sleep)., Disp: , Rfl:  .  QUEtiapine (SEROQUEL) 25 MG tablet, Take 75 mg by mouth See admin instructions. Take 3 tablets (75mg ) by mouth at night as needed to reestablish sleep pattern, Disp: , Rfl:  .  tiZANidine (ZANAFLEX) 4 MG tablet, Take 4 mg by mouth 2 (two) times daily as needed for muscle spasms., Disp: , Rfl:  .  Ubrogepant (UBRELVY) 50 MG TABS, Take 100 mg by mouth daily as needed (migraine)., Disp: , Rfl:   Assessment and Plan:  1. Hip pain, bilateral   2. Chronic, continuous use of opioids   3. Chronic pain syndrome   4. Avascular necrosis of bone ([place])   5. Generalized anxiety disorder   6. Intractable migraine with aura with status migrainosus   7. Chronic tension-type headache, intractable   8. Osteoarthritis of both shoulders, unspecified osteoarthritis type   Based on our discussion today and upon review of the [place] practitioner database information I am going to refill his medications for August 1.  We will schedule him for return to clinic in 1 month for reevaluation.  He is to contact us should any problems in the meantime.  He is also to continue follow-up with his primary care physicians for his baseline medical care. Follow Up Instructions:    I discussed the assessment and treatment plan with the patient. The patient was provided an opportunity to ask questions and all were answered. The patient agreed with the plan and demonstrated an understanding of the instructions.   The patient was advised to call back or seek an in-person evaluation if the  symptoms worsen or if the condition fails to improve as anticipated.  I provided 30 minutes of non-face-to-face time during this encounter.   Molli Barrows, MD

## 2018-09-10 ENCOUNTER — Other Ambulatory Visit: Payer: Self-pay | Admitting: Anesthesiology

## 2018-09-28 ENCOUNTER — Other Ambulatory Visit: Payer: Self-pay

## 2018-09-28 ENCOUNTER — Ambulatory Visit: Payer: BC Managed Care – PPO | Attending: Anesthesiology | Admitting: Anesthesiology

## 2018-09-28 ENCOUNTER — Encounter: Payer: Self-pay | Admitting: Anesthesiology

## 2018-09-28 DIAGNOSIS — M19012 Primary osteoarthritis, left shoulder: Secondary | ICD-10-CM

## 2018-09-28 DIAGNOSIS — M25551 Pain in right hip: Secondary | ICD-10-CM

## 2018-09-28 DIAGNOSIS — M8700 Idiopathic aseptic necrosis of unspecified bone: Secondary | ICD-10-CM | POA: Diagnosis not present

## 2018-09-28 DIAGNOSIS — F1190 Opioid use, unspecified, uncomplicated: Secondary | ICD-10-CM

## 2018-09-28 DIAGNOSIS — G894 Chronic pain syndrome: Secondary | ICD-10-CM

## 2018-09-28 DIAGNOSIS — M25552 Pain in left hip: Secondary | ICD-10-CM

## 2018-09-28 DIAGNOSIS — M19011 Primary osteoarthritis, right shoulder: Secondary | ICD-10-CM

## 2018-09-28 MED ORDER — HYDROMORPHONE HCL 4 MG PO TABS: 4.0000 mg | ORAL_TABLET | ORAL | 0 refills | Status: DC | PRN

## 2018-09-28 MED ORDER — HYDROMORPHONE HCL 4 MG PO TABS: 4.0000 mg | ORAL_TABLET | ORAL | 0 refills | Status: AC | PRN

## 2018-09-28 NOTE — Progress Notes (Signed)
Virtual Visit via Video Note  I connected with Carl Solis on 09/28/18 at  1:00 PM EDT by a video enabled telemedicine application and verified that I am speaking with the correct person using two identifiers.  Location: Patient: Home Provider: Pain control center   I discussed the limitations of evaluation and management by telemedicine and the availability of in person appointments. The patient expressed understanding and agreed to proceed.  History of Present Illness: I spoke with Carl Solis today regarding his diffuse body pain shoulder pain and hip pain.  Unfortunately this condition continues and he is currently scheduled to see his orthopedist for possible bilateral shoulder surgery he mentions today.  Otherwise the quality characteristic distribution of his diffuse pain is stable.  He still taking his Dilaudid 5 mg tablets as scheduled and these are working.  He denies any diverting or illicit use.  The medications continue to give him good relief.  He states that the quality characteristic and distribution of the pain remains stable in nature despite efforts at stretching strengthening.  Unfortunately he has failed conservative therapy with anti-inflammatory medications alone or physical therapy.    Observations/Objective:  Current Outpatient Medications:  .  ARIPiprazole (ABILIFY) 5 MG tablet, Take 5 mg by mouth daily., Disp: , Rfl:  .  Butalbital-APAP-Caffeine 50-300-40 MG CAPS, Take 1-2 capsules by mouth every 4 (four) hours as needed (migraine). , Disp: , Rfl:  .  candesartan (ATACAND) 8 MG tablet, Take 16 mg by mouth daily. , Disp: , Rfl:  .  citalopram (CELEXA) 40 MG tablet, Take 40 mg by mouth daily. , Disp: , Rfl:  .  diazepam (VALIUM) 5 MG tablet, Take 5 mg by mouth every 8 (eight) hours as needed for anxiety. , Disp: , Rfl:  .  EPINEPHrine 0.3 mg/0.3 mL IJ SOAJ injection, Inject 0.3 mLs into the skin as needed for anaphylaxis. , Disp: , Rfl:  .  fluticasone (FLONASE) 50  MCG/ACT nasal spray, Place 2 sprays into both nostrils daily. , Disp: , Rfl:  .  HYDROmorphone (DILAUDID) 4 MG tablet, Take 1 tablet (4 mg total) by mouth every 4 (four) hours as needed for severe pain., Disp: 150 tablet, Rfl: 0 .  ondansetron (ZOFRAN ODT) 4 MG disintegrating tablet, Take 1 tablet (4 mg total) by mouth every 8 (eight) hours as needed for nausea or vomiting., Disp: 20 tablet, Rfl: 0 .  pantoprazole (PROTONIX) 40 MG tablet, Take 40 mg by mouth 2 (two) times daily. , Disp: , Rfl:  .  QUEtiapine (SEROQUEL) 100 MG tablet, Take 100 mg by mouth at bedtime as needed (sleep)., Disp: , Rfl:  .  QUEtiapine (SEROQUEL) 25 MG tablet, Take 75 mg by mouth See admin instructions. Take 3 tablets (75mg ) by mouth at night as needed to reestablish sleep pattern, Disp: , Rfl:  .  tiZANidine (ZANAFLEX) 4 MG tablet, Take 4 mg by mouth 2 (two) times daily as needed for muscle spasms., Disp: , Rfl:  .  Ubrogepant (UBRELVY) 50 MG TABS, Take 100 mg by mouth daily as needed (migraine)., Disp: , Rfl:   Assessment and Plan: 1. Hip pain, bilateral   2. Chronic, continuous use of opioids   3. Chronic pain syndrome   4. Avascular necrosis of bone ([place])   5. Osteoarthritis of both shoulders, unspecified osteoarthritis type   Based on our discussion today I am going to refill his medications for the next 2 months.  I will schedule him for 12-month return to clinic.  Should he have any problems in the meantime he is instructed to contact us at the pain control center.  I encouraged him to follow-up with his orthopedic surgeons regarding his shoulder care and continue follow-up with his medical doctors for his baseline medical care.  Follow Up Instructions:    I discussed the assessment and treatment plan with the patient. The patient was provided an opportunity to ask questions and all were answered. The patient agreed with the plan and demonstrated an understanding of the instructions.   The patient was advised to  call back or seek an in-person evaluation if the symptoms worsen or if the condition fails to improve as anticipated.  I provided 30 minutes of non-face-to-face time during this encounter.   Molli Barrows, MD

## 2018-10-22 ENCOUNTER — Encounter: Payer: Self-pay | Admitting: Anesthesiology

## 2018-10-27 ENCOUNTER — Other Ambulatory Visit: Payer: Self-pay | Admitting: Orthopedic Surgery

## 2018-11-03 ENCOUNTER — Other Ambulatory Visit: Admission: RE | Admit: 2018-11-03 | Payer: BC Managed Care – PPO | Source: Ambulatory Visit

## 2018-11-04 ENCOUNTER — Encounter
Admission: RE | Admit: 2018-11-04 | Discharge: 2018-11-04 | Disposition: A | Discharge status: Home / Self Care | Payer: BC Managed Care – PPO | Source: Ambulatory Visit | Attending: Orthopedic Surgery | Admitting: Orthopedic Surgery

## 2018-11-04 ENCOUNTER — Other Ambulatory Visit: Admission: RE | Admit: 2018-11-04 | Payer: BC Managed Care – PPO | Source: Ambulatory Visit

## 2018-11-04 ENCOUNTER — Encounter: Payer: Self-pay | Admitting: Anesthesiology

## 2018-11-04 ENCOUNTER — Other Ambulatory Visit: Payer: Self-pay

## 2018-11-04 DIAGNOSIS — Z01812 Encounter for preprocedural laboratory examination: Secondary | ICD-10-CM | POA: Insufficient documentation

## 2018-11-04 DIAGNOSIS — M87021 Idiopathic aseptic necrosis of right humerus: Secondary | ICD-10-CM | POA: Diagnosis not present

## 2018-11-04 DIAGNOSIS — Z20828 Contact with and (suspected) exposure to other viral communicable diseases: Secondary | ICD-10-CM | POA: Diagnosis not present

## 2018-11-04 HISTORY — DX: Personal history of urinary calculi: Z87.442

## 2018-11-04 LAB — URINALYSIS, ROUTINE W REFLEX MICROSCOPIC
Bacteria, UA: NONE SEEN
Bilirubin Urine: NEGATIVE
Glucose, UA: NEGATIVE mg/dL
Hgb urine dipstick: NEGATIVE
Ketones, ur: NEGATIVE mg/dL
Leukocytes,Ua: NEGATIVE
Nitrite: NEGATIVE
Protein, ur: NEGATIVE mg/dL
Specific Gravity, Urine: 1.006 (ref 1.005–1.030)
WBC, UA: NONE SEEN WBC/hpf (ref 0–5)
pH: 6 (ref 5.0–8.0)

## 2018-11-04 LAB — CBC
HCT: 43.3 % (ref 39.0–52.0)
Hemoglobin: 14.5 g/dL (ref 13.0–17.0)
MCH: 28.8 pg (ref 26.0–34.0)
MCHC: 33.5 g/dL (ref 30.0–36.0)
MCV: 85.9 fL (ref 80.0–100.0)
Platelets: 186 10*3/uL (ref 150–400)
RBC: 5.04 MIL/uL (ref 4.22–5.81)
RDW: 14 % (ref 11.5–15.5)
WBC: 11.3 10*3/uL — ABNORMAL HIGH (ref 4.0–10.5)
nRBC: 0 % (ref 0.0–0.2)

## 2018-11-04 LAB — BASIC METABOLIC PANEL
Anion gap: 8 (ref 5–15)
BUN: 8 mg/dL (ref 6–20)
CO2: 31 mmol/L (ref 22–32)
Calcium: 9.2 mg/dL (ref 8.9–10.3)
Chloride: 100 mmol/L (ref 98–111)
Creatinine, Ser: 1.12 mg/dL (ref 0.61–1.24)
GFR calc Af Amer: >60 mL/min (ref >60)
GFR calc non Af Amer: >60 mL/min (ref >60)
Glucose, Bld: 91 mg/dL (ref 70–99)
Potassium: 4.1 mmol/L (ref 3.5–5.1)
Sodium: 139 mmol/L (ref 135–145)

## 2018-11-04 LAB — PROTIME-INR
INR: 1 (ref 0.8–1.2)
Prothrombin Time: 13.4 seconds (ref 11.4–15.2)

## 2018-11-04 LAB — SURGICAL PCR SCREEN
MRSA, PCR: NEGATIVE
Staphylococcus aureus: NEGATIVE

## 2018-11-04 LAB — APTT: aPTT: 31 seconds (ref 24–36)

## 2018-11-04 NOTE — Patient Instructions (Addendum)
Your procedure is scheduled on: Wednesday 11/09/18.   Report to DAY SURGERY DEPARTMENT LOCATED ON 2ND FLOOR MEDICAL MALL ENTRANCE. To find out your arrival time please call [phone] between 1PM - 3PM on Tuesday 11/08/18.    Remember: Instructions that are not followed completely may result in serious medical risk, up to and including death, or upon the discretion of your surgeon and anesthesiologist your surgery may need to be rescheduled.      _X__ 1. Do not eat food after midnight the night before your procedure.                 No gum chewing or hard candies. You may drink clear liquids up to 2 hours                 before you are scheduled to arrive for your surgery- DO NOT drink clear                 liquids within 2 hours of the start of your surgery.                 Clear Liquids include:  water, apple juice without pulp, clear carbohydrate                 drink such as Clearfast or Gatorade, Black Coffee or Tea (Do not add                 milk or creamer to coffee or tea).   **Please drink your Pre-Surgery Ensure on the morning of your procedure. Finish 2 hours before your arrival time.**   __X__2.  On the morning of surgery brush your teeth with toothpaste and water, you may rinse your mouth with mouthwash if you wish.  Do not swallow any toothpaste or mouthwash.       _X__ 3.  No Alcohol for 24 hours before or after surgery.     _X__ 4.  Do Not Smoke or use e-cigarettes For 24 Hours Prior to Your Surgery.                 Do not use any chewable tobacco products for at least 6 hours prior to                 surgery.    __X__6.  Notify your doctor if there is any change in your medical condition      (cold, fever, infections).       Do not wear jewelry, make-up, hairpins, clips or nail polish. Do not wear lotions, powders, or perfumes.  Do not shave 48 hours prior to surgery. Men may shave face and neck. Do not bring valuables to the hospital.      [facility] is not responsible for any belongings or valuables.    Contacts, dentures/partials or body piercings may not be worn into surgery. Bring a case for your contacts, glasses or hearing aids, a denture cup will be supplied.      Please read over the following fact sheets that you were given:   MRSA Information    __X__ Take these medicines the morning of surgery with A SIP OF WATER:     1. albuterol (VENTOLIN HFA) 108 (90 Base) MCG/ACT inhaler  2. ARIPiprazole (ABILIFY) 5 MG tablet  3. cetirizine (ZYRTEC) 10 MG tablet  4. citalopram (CELEXA) 40 MG tablet  5. fluticasone (FLONASE) 50 MCG/ACT nasal spray  6. pantoprazole (PROTONIX) 40 MG tablet  7.  HYDROmorphone (DILAUDID) 4 MG tablet if needed  8. QUEtiapine (SEROQUEL) 25 MG tablet if needed  9. diazepam (VALIUM) 5 MG tablet if needed  10. ondansetron (ZOFRAN ODT) 4 MG disintegrating tablet if needed     __X__ Use CHG Soap as directed    _ X___ Use inhalers on the day of surgery. Also bring the inhaler with you to the hospital on the morning of surgery.     __X__ Stop Anti-inflammatories 7 days before surgery such as Advil, Ibuprofen, Motrin, BC or Goodies Powder, Naprosyn, Naproxen, Aleve, Aspirin, Meloxicam. May take Tylenol or Dilaudid if needed for pain or discomfort.     __X__ Do not begin taking any new herbal supplements before your procedure.

## 2018-11-05 LAB — SARS CORONAVIRUS 2 (TAT 6-24 HRS): SARS Coronavirus 2: NEGATIVE

## 2018-11-08 MED ORDER — TRANEXAMIC ACID-NACL 1000-0.7 MG/100ML-% IV SOLN
1000.0000 mg | INTRAVENOUS | Status: AC
  Administered 2018-11-09: 1000 mg via INTRAVENOUS
  Filled 2018-11-08: qty 100

## 2018-11-08 MED ORDER — CLINDAMYCIN PHOSPHATE 900 MG/50ML IV SOLN
900.0000 mg | INTRAVENOUS | Status: AC
  Administered 2018-11-09: 900 mg via INTRAVENOUS

## 2018-11-09 ENCOUNTER — Inpatient Hospital Stay: Payer: BC Managed Care – PPO | Admitting: Anesthesiology

## 2018-11-09 ENCOUNTER — Encounter
Admission: RE | Disposition: A | Discharge status: Home / Self Care | Payer: Self-pay | Source: Home / Self Care | Attending: Orthopedic Surgery

## 2018-11-09 ENCOUNTER — Inpatient Hospital Stay
Admission: RE | Admit: 2018-11-09 | Discharge: 2018-11-10 | DRG: 483 | Disposition: A | Discharge status: Home / Self Care | Payer: BC Managed Care – PPO | Attending: Orthopedic Surgery | Admitting: Orthopedic Surgery

## 2018-11-09 ENCOUNTER — Other Ambulatory Visit: Payer: Self-pay

## 2018-11-09 ENCOUNTER — Ambulatory Visit: Payer: Self-pay

## 2018-11-09 ENCOUNTER — Inpatient Hospital Stay: Payer: BC Managed Care – PPO

## 2018-11-09 ENCOUNTER — Encounter: Payer: Self-pay | Admitting: *Deleted

## 2018-11-09 DIAGNOSIS — Z79891 Long term (current) use of opiate analgesic: Secondary | ICD-10-CM

## 2018-11-09 DIAGNOSIS — F419 Anxiety disorder, unspecified: Secondary | ICD-10-CM | POA: Diagnosis present

## 2018-11-09 DIAGNOSIS — T50905A Adverse effect of unspecified drugs, medicaments and biological substances, initial encounter: Secondary | ICD-10-CM | POA: Diagnosis present

## 2018-11-09 DIAGNOSIS — M87111 Osteonecrosis due to drugs, right shoulder: Secondary | ICD-10-CM | POA: Diagnosis present

## 2018-11-09 DIAGNOSIS — Z7982 Long term (current) use of aspirin: Secondary | ICD-10-CM

## 2018-11-09 DIAGNOSIS — Z91040 Latex allergy status: Secondary | ICD-10-CM

## 2018-11-09 DIAGNOSIS — F17210 Nicotine dependence, cigarettes, uncomplicated: Secondary | ICD-10-CM | POA: Diagnosis present

## 2018-11-09 DIAGNOSIS — Z882 Allergy status to sulfonamides status: Secondary | ICD-10-CM

## 2018-11-09 DIAGNOSIS — Z885 Allergy status to narcotic agent status: Secondary | ICD-10-CM | POA: Diagnosis not present

## 2018-11-09 DIAGNOSIS — Z79899 Other long term (current) drug therapy: Secondary | ICD-10-CM | POA: Diagnosis not present

## 2018-11-09 DIAGNOSIS — Z888 Allergy status to other drugs, medicaments and biological substances status: Secondary | ICD-10-CM | POA: Diagnosis not present

## 2018-11-09 DIAGNOSIS — Z09 Encounter for follow-up examination after completed treatment for conditions other than malignant neoplasm: Secondary | ICD-10-CM

## 2018-11-09 DIAGNOSIS — Z880 Allergy status to penicillin: Secondary | ICD-10-CM | POA: Diagnosis not present

## 2018-11-09 DIAGNOSIS — G894 Chronic pain syndrome: Secondary | ICD-10-CM | POA: Diagnosis present

## 2018-11-09 HISTORY — PX: TOTAL SHOULDER ARTHROPLASTY: SHX126

## 2018-11-09 SURGERY — ARTHROPLASTY, SHOULDER, TOTAL
Anesthesia: General | Site: Shoulder | Laterality: Right

## 2018-11-09 MED ORDER — CITALOPRAM HYDROBROMIDE 20 MG PO TABS
40.0000 mg | ORAL_TABLET | Freq: Every day | ORAL | Status: DC
  Administered 2018-11-10: 09:00:00 40 mg via ORAL
  Filled 2018-11-09 (×2): qty 2

## 2018-11-09 MED ORDER — MAGNESIUM HYDROXIDE 400 MG/5ML PO SUSP: 30.0000 mL | Freq: Every day | ORAL | Status: DC | PRN

## 2018-11-09 MED ORDER — CHLORHEXIDINE GLUCONATE 4 % EX LIQD: 60.0000 mL | Freq: Once | CUTANEOUS | Status: DC

## 2018-11-09 MED ORDER — BUPIVACAINE LIPOSOME 1.3 % IJ SUSP
INTRAMUSCULAR | Status: DC | PRN
  Administered 2018-11-09: 20 mL via PERINEURAL

## 2018-11-09 MED ORDER — FENTANYL CITRATE (PF) 100 MCG/2ML IJ SOLN
100.0000 ug | Freq: Once | INTRAMUSCULAR | Status: AC
  Administered 2018-11-09: 08:00:00 50 ug via INTRAVENOUS

## 2018-11-09 MED ORDER — MIDAZOLAM HCL 2 MG/2ML IJ SOLN
INTRAMUSCULAR | Status: AC
  Filled 2018-11-09: qty 2

## 2018-11-09 MED ORDER — MENTHOL 3 MG MT LOZG
1.0000 | LOZENGE | OROMUCOSAL | Status: DC | PRN
  Filled 2018-11-09: qty 9

## 2018-11-09 MED ORDER — HYDROMORPHONE HCL 1 MG/ML IJ SOLN
0.5000 mg | INTRAMUSCULAR | Status: DC | PRN
  Administered 2018-11-09 (×2): 1 mg via INTRAVENOUS
  Filled 2018-11-09 (×2): qty 1

## 2018-11-09 MED ORDER — MIDAZOLAM HCL 2 MG/2ML IJ SOLN
1.0000 mg | Freq: Once | INTRAMUSCULAR | Status: AC
  Administered 2018-11-09: 08:00:00 1 mg via INTRAVENOUS

## 2018-11-09 MED ORDER — ONDANSETRON 4 MG PO TBDP: 4.0000 mg | ORAL_TABLET | Freq: Three times a day (TID) | ORAL | Status: DC | PRN

## 2018-11-09 MED ORDER — LORATADINE 10 MG PO TABS
10.0000 mg | ORAL_TABLET | Freq: Every day | ORAL | Status: DC
  Administered 2018-11-10: 10 mg via ORAL
  Filled 2018-11-09: qty 1

## 2018-11-09 MED ORDER — HYDROMORPHONE HCL 1 MG/ML IJ SOLN
INTRAMUSCULAR | Status: AC
  Administered 2018-11-09: 0.5 mg via INTRAVENOUS
  Filled 2018-11-09: qty 1

## 2018-11-09 MED ORDER — LIDOCAINE HCL (PF) 1 % IJ SOLN
INTRAMUSCULAR | Status: DC | PRN
  Administered 2018-11-09: 4 mL via INTRADERMAL

## 2018-11-09 MED ORDER — OXYCODONE HCL 5 MG PO TABS
10.0000 mg | ORAL_TABLET | ORAL | Status: DC | PRN
  Administered 2018-11-10: 10 mg via ORAL

## 2018-11-09 MED ORDER — BUPIVACAINE HCL (PF) 0.5 % IJ SOLN
INTRAMUSCULAR | Status: DC | PRN
  Administered 2018-11-09: 10 mL via PERINEURAL

## 2018-11-09 MED ORDER — OXYCODONE HCL 5 MG PO TABS
5.0000 mg | ORAL_TABLET | ORAL | Status: DC | PRN
  Administered 2018-11-09 – 2018-11-10 (×2): 10 mg via ORAL
  Filled 2018-11-09 (×3): qty 2

## 2018-11-09 MED ORDER — FENTANYL CITRATE (PF) 100 MCG/2ML IJ SOLN
INTRAMUSCULAR | Status: DC | PRN
  Administered 2018-11-09: 150 ug via INTRAVENOUS

## 2018-11-09 MED ORDER — HYDROMORPHONE HCL 1 MG/ML IJ SOLN
0.5000 mg | INTRAMUSCULAR | Status: DC | PRN
  Administered 2018-11-09 (×2): 0.5 mg via INTRAVENOUS

## 2018-11-09 MED ORDER — BUPIVACAINE HCL (PF) 0.25 % IJ SOLN
INTRAMUSCULAR | Status: AC
  Filled 2018-11-09: qty 30

## 2018-11-09 MED ORDER — PROPOFOL 10 MG/ML IV BOLUS
INTRAVENOUS | Status: DC | PRN
  Administered 2018-11-09: 150 mg via INTRAVENOUS

## 2018-11-09 MED ORDER — SUGAMMADEX SODIUM 500 MG/5ML IV SOLN
INTRAVENOUS | Status: DC | PRN
  Administered 2018-11-09: 200 mg via INTRAVENOUS

## 2018-11-09 MED ORDER — EPINEPHRINE 0.3 MG/0.3ML IJ SOAJ
0.3000 mg | INTRAMUSCULAR | Status: DC | PRN
  Filled 2018-11-09: qty 0.6

## 2018-11-09 MED ORDER — ACETAMINOPHEN 325 MG PO TABS: 325.0000 mg | ORAL_TABLET | Freq: Four times a day (QID) | ORAL | Status: DC | PRN

## 2018-11-09 MED ORDER — PHENYLEPHRINE HCL (PRESSORS) 10 MG/ML IV SOLN
INTRAVENOUS | Status: AC
  Filled 2018-11-09: qty 1

## 2018-11-09 MED ORDER — MIDAZOLAM HCL 2 MG/2ML IJ SOLN
INTRAMUSCULAR | Status: AC
  Administered 2018-11-09: 08:00:00 2 mg via INTRAVENOUS
  Filled 2018-11-09: qty 2

## 2018-11-09 MED ORDER — SODIUM CHLORIDE 0.9 % IV SOLN
INTRAVENOUS | Status: DC
  Administered 2018-11-09 (×2): via INTRAVENOUS

## 2018-11-09 MED ORDER — PHENOL 1.4 % MT LIQD
1.0000 | OROMUCOSAL | Status: DC | PRN
  Filled 2018-11-09: qty 177

## 2018-11-09 MED ORDER — QUETIAPINE FUMARATE 25 MG PO TABS: 100.0000 mg | ORAL_TABLET | Freq: Every evening | ORAL | Status: DC | PRN

## 2018-11-09 MED ORDER — EPINEPHRINE PF 1 MG/ML IJ SOLN
INTRAMUSCULAR | Status: AC
  Filled 2018-11-09: qty 1

## 2018-11-09 MED ORDER — UBROGEPANT 50 MG PO TABS: 100.0000 mg | ORAL_TABLET | Freq: Every day | ORAL | Status: DC | PRN

## 2018-11-09 MED ORDER — MIDAZOLAM HCL 2 MG/2ML IJ SOLN
2.0000 mg | Freq: Once | INTRAMUSCULAR | Status: AC
  Administered 2018-11-09: 08:00:00 2 mg via INTRAVENOUS

## 2018-11-09 MED ORDER — LIDOCAINE HCL (PF) 1 % IJ SOLN
INTRAMUSCULAR | Status: AC
  Filled 2018-11-09: qty 5

## 2018-11-09 MED ORDER — ONDANSETRON HCL 4 MG/2ML IJ SOLN: 4.0000 mg | Freq: Four times a day (QID) | INTRAMUSCULAR | Status: DC | PRN

## 2018-11-09 MED ORDER — DEXAMETHASONE SODIUM PHOSPHATE 10 MG/ML IJ SOLN
INTRAMUSCULAR | Status: DC | PRN
  Administered 2018-11-09: 5 mg via INTRAVENOUS

## 2018-11-09 MED ORDER — ONDANSETRON HCL 4 MG/2ML IJ SOLN
INTRAMUSCULAR | Status: AC
  Filled 2018-11-09: qty 2

## 2018-11-09 MED ORDER — PHENYLEPHRINE HCL (PRESSORS) 10 MG/ML IV SOLN
INTRAVENOUS | Status: DC | PRN
  Administered 2018-11-09: 200 ug via INTRAVENOUS
  Administered 2018-11-09: 100 ug via INTRAVENOUS
  Administered 2018-11-09 (×2): 200 ug via INTRAVENOUS
  Administered 2018-11-09 (×2): 100 ug via INTRAVENOUS
  Administered 2018-11-09: 200 ug via INTRAVENOUS

## 2018-11-09 MED ORDER — FENTANYL CITRATE (PF) 100 MCG/2ML IJ SOLN
INTRAMUSCULAR | Status: AC
  Administered 2018-11-09: 08:00:00 50 ug via INTRAVENOUS
  Filled 2018-11-09: qty 2

## 2018-11-09 MED ORDER — ONDANSETRON HCL 4 MG PO TABS: 4.0000 mg | ORAL_TABLET | Freq: Four times a day (QID) | ORAL | Status: DC | PRN

## 2018-11-09 MED ORDER — LACTATED RINGERS IV SOLN
INTRAVENOUS | Status: DC | PRN
  Administered 2018-11-09: 09:00:00 via INTRAVENOUS

## 2018-11-09 MED ORDER — SODIUM CHLORIDE 0.9 % IV SOLN
INTRAVENOUS | Status: DC | PRN
  Administered 2018-11-09: 1000 mL via INTRAVENOUS

## 2018-11-09 MED ORDER — IRBESARTAN 150 MG PO TABS
75.0000 mg | ORAL_TABLET | Freq: Every day | ORAL | Status: DC
  Administered 2018-11-10: 09:00:00 75 mg via ORAL
  Filled 2018-11-09: qty 1

## 2018-11-09 MED ORDER — ROCURONIUM BROMIDE 100 MG/10ML IV SOLN
INTRAVENOUS | Status: DC | PRN
  Administered 2018-11-09: 50 mg via INTRAVENOUS

## 2018-11-09 MED ORDER — METOCLOPRAMIDE HCL 10 MG PO TABS: 5.0000 mg | ORAL_TABLET | Freq: Three times a day (TID) | ORAL | Status: DC | PRN

## 2018-11-09 MED ORDER — DIAZEPAM 5 MG PO TABS: 5.0000 mg | ORAL_TABLET | Freq: Three times a day (TID) | ORAL | Status: DC | PRN

## 2018-11-09 MED ORDER — SUCCINYLCHOLINE CHLORIDE 20 MG/ML IJ SOLN
INTRAMUSCULAR | Status: AC
  Filled 2018-11-09: qty 1

## 2018-11-09 MED ORDER — FENTANYL CITRATE (PF) 100 MCG/2ML IJ SOLN: 25.0000 ug | INTRAMUSCULAR | Status: DC | PRN

## 2018-11-09 MED ORDER — BUPIVACAINE-EPINEPHRINE (PF) 0.25% -1:200000 IJ SOLN
INTRAMUSCULAR | Status: DC | PRN
  Administered 2018-11-09: 20 mL

## 2018-11-09 MED ORDER — METOCLOPRAMIDE HCL 5 MG/ML IJ SOLN: 5.0000 mg | Freq: Three times a day (TID) | INTRAMUSCULAR | Status: DC | PRN

## 2018-11-09 MED ORDER — ALBUTEROL SULFATE (2.5 MG/3ML) 0.083% IN NEBU: 3.0000 mL | INHALATION_SOLUTION | Freq: Four times a day (QID) | RESPIRATORY_TRACT | Status: DC | PRN

## 2018-11-09 MED ORDER — CLINDAMYCIN PHOSPHATE 600 MG/50ML IV SOLN
600.0000 mg | Freq: Four times a day (QID) | INTRAVENOUS | Status: AC
  Administered 2018-11-09 – 2018-11-10 (×3): 600 mg via INTRAVENOUS
  Filled 2018-11-09 (×3): qty 50

## 2018-11-09 MED ORDER — CLINDAMYCIN PHOSPHATE 900 MG/50ML IV SOLN
INTRAVENOUS | Status: AC
  Filled 2018-11-09: qty 50

## 2018-11-09 MED ORDER — PROPOFOL 10 MG/ML IV BOLUS
INTRAVENOUS | Status: AC
  Filled 2018-11-09: qty 20

## 2018-11-09 MED ORDER — ROCURONIUM BROMIDE 50 MG/5ML IV SOLN
INTRAVENOUS | Status: AC
  Filled 2018-11-09: qty 1

## 2018-11-09 MED ORDER — FLUTICASONE PROPIONATE 50 MCG/ACT NA SUSP
2.0000 | Freq: Every day | NASAL | Status: DC
  Administered 2018-11-10: 09:00:00 2 via NASAL
  Filled 2018-11-09: qty 16

## 2018-11-09 MED ORDER — DEXMEDETOMIDINE HCL 200 MCG/2ML IV SOLN
INTRAVENOUS | Status: DC | PRN
  Administered 2018-11-09: 12 ug via INTRAVENOUS

## 2018-11-09 MED ORDER — LIDOCAINE HCL (CARDIAC) PF 100 MG/5ML IV SOSY
PREFILLED_SYRINGE | INTRAVENOUS | Status: DC | PRN
  Administered 2018-11-09: 60 mg via INTRAVENOUS

## 2018-11-09 MED ORDER — KCL IN DEXTROSE-NACL 20-5-0.45 MEQ/L-%-% IV SOLN
INTRAVENOUS | Status: DC
  Filled 2018-11-09 (×3): qty 1000

## 2018-11-09 MED ORDER — QUETIAPINE FUMARATE 25 MG PO TABS: 75.0000 mg | ORAL_TABLET | Freq: Every day | ORAL | Status: DC | PRN

## 2018-11-09 MED ORDER — MIDAZOLAM HCL 2 MG/2ML IJ SOLN
INTRAMUSCULAR | Status: AC
  Administered 2018-11-09: 1 mg via INTRAVENOUS
  Filled 2018-11-09: qty 2

## 2018-11-09 MED ORDER — DOCUSATE SODIUM 100 MG PO CAPS
100.0000 mg | ORAL_CAPSULE | Freq: Two times a day (BID) | ORAL | Status: DC
  Administered 2018-11-09 – 2018-11-10 (×2): 100 mg via ORAL
  Filled 2018-11-09 (×2): qty 1

## 2018-11-09 MED ORDER — HYDROMORPHONE HCL 2 MG PO TABS
4.0000 mg | ORAL_TABLET | ORAL | Status: DC | PRN
  Administered 2018-11-09 – 2018-11-10 (×3): 4 mg via ORAL
  Filled 2018-11-09 (×3): qty 2

## 2018-11-09 MED ORDER — MIDAZOLAM HCL 2 MG/2ML IJ SOLN
INTRAMUSCULAR | Status: DC | PRN
  Administered 2018-11-09: 2 mg via INTRAVENOUS

## 2018-11-09 MED ORDER — ONDANSETRON HCL 4 MG/2ML IJ SOLN
INTRAMUSCULAR | Status: DC | PRN
  Administered 2018-11-09: 4 mg via INTRAVENOUS

## 2018-11-09 MED ORDER — BUPIVACAINE LIPOSOME 1.3 % IJ SUSP
INTRAMUSCULAR | Status: AC
  Filled 2018-11-09: qty 20

## 2018-11-09 MED ORDER — PANTOPRAZOLE SODIUM 40 MG PO TBEC
40.0000 mg | DELAYED_RELEASE_TABLET | Freq: Two times a day (BID) | ORAL | Status: DC
  Administered 2018-11-09 – 2018-11-10 (×2): 40 mg via ORAL
  Filled 2018-11-09 (×2): qty 1

## 2018-11-09 MED ORDER — ONDANSETRON HCL 4 MG/2ML IJ SOLN
4.0000 mg | Freq: Once | INTRAMUSCULAR | Status: AC | PRN
  Administered 2018-11-09: 12:00:00 4 mg via INTRAVENOUS

## 2018-11-09 MED ORDER — MAGNESIUM CITRATE PO SOLN
1.0000 | Freq: Once | ORAL | Status: DC | PRN
  Filled 2018-11-09: qty 296

## 2018-11-09 MED ORDER — ARIPIPRAZOLE 5 MG PO TABS
5.0000 mg | ORAL_TABLET | Freq: Every day | ORAL | Status: DC
  Administered 2018-11-10: 09:00:00 5 mg via ORAL
  Filled 2018-11-09: qty 1

## 2018-11-09 MED ORDER — FENTANYL CITRATE (PF) 250 MCG/5ML IJ SOLN
INTRAMUSCULAR | Status: AC
  Filled 2018-11-09: qty 5

## 2018-11-09 MED ORDER — BUPIVACAINE HCL (PF) 0.5 % IJ SOLN
INTRAMUSCULAR | Status: AC
  Filled 2018-11-09: qty 10

## 2018-11-09 SURGICAL SUPPLY — 57 items
BLADE BOVIE TIP EXT 4 (BLADE) ×3 IMPLANT
BLADE SAW 1 (BLADE) IMPLANT
BOWL CEMENT MIX W/ADAPTER (MISCELLANEOUS) IMPLANT
BRUSH SCRUB EZ  4% CHG (MISCELLANEOUS) ×4
BRUSH SCRUB EZ 4% CHG (MISCELLANEOUS) ×2 IMPLANT
CANISTER SUCT 1200ML W/VALVE (MISCELLANEOUS) ×3 IMPLANT
CANISTER SUCT 3000ML PPV (MISCELLANEOUS) ×3 IMPLANT
CHLORAPREP W/TINT 26 (MISCELLANEOUS) ×6 IMPLANT
COVER BACK TABLE REUSABLE LG (DRAPES) ×3 IMPLANT
COVER WAND RF STERILE (DRAPES) ×3 IMPLANT
CRADLE LAMINECT ARM (MISCELLANEOUS) ×6 IMPLANT
DRAPE 3/4 80X56 (DRAPES) ×6 IMPLANT
DRAPE INCISE IOBAN 66X60 STRL (DRAPES) ×6 IMPLANT
DRAPE SPLIT 6X30 W/TAPE (DRAPES) ×6 IMPLANT
DRAPE U-SHAPE 47X51 STRL (DRAPES) ×3 IMPLANT
DRSG TEGADERM 6X8 (GAUZE/BANDAGES/DRESSINGS) ×3 IMPLANT
ELECT REM PT RETURN 9FT ADLT (ELECTROSURGICAL) ×3
ELECTRODE REM PT RTRN 9FT ADLT (ELECTROSURGICAL) ×1 IMPLANT
GAUZE SPONGE 4X4 12PLY STRL (GAUZE/BANDAGES/DRESSINGS) ×3 IMPLANT
GAUZE XEROFORM 1X8 LF (GAUZE/BANDAGES/DRESSINGS) ×3 IMPLANT
GLOBAL CAP 6" GUIDE PIN ×3 IMPLANT
GLOVE INDICATOR 8.0 STRL GRN (GLOVE) ×3 IMPLANT
GLOVE SURG ORTHO 8.0 STRL STRW (GLOVE) ×3 IMPLANT
GOWN STRL REUS W/ TWL LRG LVL3 (GOWN DISPOSABLE) ×3 IMPLANT
GOWN STRL REUS W/ TWL XL LVL3 (GOWN DISPOSABLE) ×1 IMPLANT
GOWN STRL REUS W/TWL LRG LVL3 (GOWN DISPOSABLE) ×6
GOWN STRL REUS W/TWL XL LVL3 (GOWN DISPOSABLE) ×2
HEAD HUMERAL SHOULDER 48X18 (Head) ×1 IMPLANT
HOOD PEEL AWAY FLYTE STAYCOOL (MISCELLANEOUS) ×12 IMPLANT
IV NS 1000ML (IV SOLUTION) ×2
IV NS 1000ML BAXH (IV SOLUTION) ×1 IMPLANT
KIT STABILIZATION SHOULDER (MISCELLANEOUS) ×3 IMPLANT
KIT TURNOVER KIT A (KITS) ×3 IMPLANT
MASK FACE SPIDER DISP (MASK) ×3 IMPLANT
MAT ABSORB  FLUID 56X50 GRAY (MISCELLANEOUS) ×2
MAT ABSORB FLUID 56X50 GRAY (MISCELLANEOUS) ×1 IMPLANT
NDL SAFETY ECLIPSE 18X1.5 (NEEDLE) IMPLANT
NEEDLE HYPO 18GX1.5 SHARP (NEEDLE)
NEEDLE REVERSE CUT 1/2 CRC (NEEDLE) ×3 IMPLANT
NS IRRIG 1000ML POUR BTL (IV SOLUTION) ×3 IMPLANT
PACK ARTHROSCOPY SHOULDER (MISCELLANEOUS) ×3 IMPLANT
PIN GUIDE 6IN THRD GLOB CTA (MISCELLANEOUS) ×3 IMPLANT
PULSAVAC PLUS IRRIG FAN TIP (DISPOSABLE) ×3
SHOULDER HD HUM 48X18 (Head) ×3 IMPLANT
SLING ARM LRG DEEP (SOFTGOODS) ×3 IMPLANT
SPONGE LAP 18X18 RF (DISPOSABLE) IMPLANT
STAPLER SKIN PROX 35W (STAPLE) ×3 IMPLANT
STRAP SAFETY 5IN WIDE (MISCELLANEOUS) ×3 IMPLANT
SUT TICRON 2-0 30IN 311381 (SUTURE) IMPLANT
SUT VIC AB 0 CT2 27 (SUTURE) ×3 IMPLANT
SUT VIC AB 2-0 CT1 18 (SUTURE) ×3 IMPLANT
SUT VIC AB PLUS 45CM 1-MO-4 (SUTURE) IMPLANT
SYR 10ML LL (SYRINGE) ×3 IMPLANT
SYRINGE IRR TOOMEY STRL 70CC (SYRINGE) IMPLANT
TAPE SUT 30 1/2 CRC GREEN (SUTURE) ×9 IMPLANT
TIP FAN IRRIG PULSAVAC PLUS (DISPOSABLE) ×1 IMPLANT
WATER STERILE IRR 1000ML POUR (IV SOLUTION) IMPLANT

## 2018-11-09 NOTE — TOC Initial Note (Addendum)
Transition of Care [facility]) - Initial/Assessment Note    Patient Details  Name: Carl Solis MRN: [id number] Date of Birth: 09/18/1985  [facility]) CM/SW Contact:    Cynitha Berte, Lenice Llamas Phone Number: [phone]  11/09/2018, 3:35 PM  Clinical Narrative: Clinical Social Worker ([place]) contacted surgeon's office [facility] and confirmed that patient has an outpatient PT appointment on Wednesday Oct. 14th at 12 pm. CSW met with patient alone at bedside to discuss D/C plan. Patient was alert and oriented X4 and was sitting up in the bed. CSW introduced self and explained role of CSW department. Per patient he lives in [place] with his husband Louie Casa and his mother. Patient reported that he has canes and walkers at home. Patient reported that he had 2 hip replacements at the same time in the past. CSW made patient aware of his outpatient PT apportionment time. Patient reported that his mother will transport him to outpatient PT. Patient reported no other needs or concerns. MD aware of above. CSW will continue to follow and assist as needed.              Expected Discharge Plan: OP Rehab Barriers to Discharge: Continued Medical Work up   Patient Goals and CMS Choice Patient states their goals for this hospitalization and ongoing recovery are:: To go home.      Expected Discharge Plan and Services Expected Discharge Plan: OP Rehab In-house Referral: Clinical Social Work     Living arrangements for the past 2 months: Single Family Home                 DME Arranged: N/A         HH Arranged: NA          Prior Living Arrangements/Services Living arrangements for the past 2 months: Single Family Home Lives with:: Spouse, Parents Patient language and need for interpreter reviewed:: No Do you feel safe going back to the place where you live?: Yes      Need for Family Participation in Patient Care: No (Comment) Care giver support system in place?: Yes  (comment) Current home services: DME(Patient has canes and walkers at home.) Criminal Activity/Legal Involvement Pertinent to Current Situation/Hospitalization: No - Comment as needed  Activities of Daily Living Home Assistive Devices/Equipment: Dentures (specify type), Shower chair with back, Cane (specify quad or straight) ADL Screening (condition at time of admission) Patient's cognitive ability adequate to safely complete daily activities?: Yes Is the patient deaf or have difficulty hearing?: No Does the patient have difficulty seeing, even when wearing glasses/contacts?: No Does the patient have difficulty concentrating, remembering, or making decisions?: No Patient able to express need for assistance with ADLs?: Yes Does the patient have difficulty dressing or bathing?: Yes Independently performs ADLs?: No Communication: Independent Dressing (OT): Needs assistance Is this a change from baseline?: Change from baseline, expected to last <3days Grooming: Needs assistance Is this a change from baseline?: Change from baseline, expected to last <3 days Feeding: Independent Bathing: Needs assistance Is this a change from baseline?: Change from baseline, expected to last <3 days Toileting: Independent In/Out Bed: Independent Walks in Home: Independent Does the patient have difficulty walking or climbing stairs?: No Weakness of Legs: None Weakness of Arms/Hands: Both  Permission Sought/Granted                  Emotional Assessment Appearance:: Appears stated age Attitude/Demeanor/Rapport: Engaged Affect (typically observed): Calm, Pleasant Orientation: : Oriented to Self, Oriented to Place,  Oriented to  Time, Oriented to Situation Alcohol / Substance Use: Not Applicable Psych Involvement: No (comment)  Admission diagnosis:  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 Avascular necrosis ([place]) 08/02/2018  . Hip pain, bilateral 08/02/2018  . Chronic, continuous use of opioids 08/02/2018  . Chronic pain syndrome 08/02/2018  . Acute cholecystitis 07/19/2018  . Avascular necrosis of bone ([place]) 02/26/2015  . Anxiety disorder 07/25/2014  . Headache, migraine 03/13/2014  . Cephalalgia 09/05/2013  . Disordered sleep 09/05/2013  . Current tobacco use 09/05/2013   PCP:  Idelle Crouch, MD Pharmacy:   [id number] [place] [address] [place]Phone: [phone]: [phone]    Social Determinants of Health (SDOH) Interventions    Readmission Risk Interventions No flowsheet data found.

## 2018-11-09 NOTE — Anesthesia Preprocedure Evaluation (Signed)
Anesthesia Evaluation  Patient identified by MRN, date of birth, ID band Patient awake    Reviewed: Allergy & Precautions, H&P , NPO status , Patient's Chart, lab work & pertinent test results, reviewed documented beta blocker date and time   Airway Mallampati: II  TM Distance: >3 FB Neck ROM: full    Dental  (+) Teeth Intact   Pulmonary neg pulmonary ROS, Current SmokerPatient did not abstain from smoking.,    Pulmonary exam normal        Cardiovascular Exercise Tolerance: Good negative cardio ROS Normal cardiovascular exam Rhythm:regular Rate:Normal     Neuro/Psych  Headaches, PSYCHIATRIC DISORDERS Anxiety    GI/Hepatic negative GI ROS, Neg liver ROS,   Endo/Other  negative endocrine ROS  Renal/GU negative Renal ROS  negative genitourinary   Musculoskeletal   Abdominal   Peds  Hematology negative hematology ROS (+)   Anesthesia Other Findings Past Medical History: No date: Avascular necrosis ([place]) 08/02/2018: Avascular necrosis ([place]) 08/02/2018: Chronic pain syndrome 08/02/2018: Chronic, continuous use of opioids 08/02/2018: Hip pain, bilateral No date: History of kidney stones No date: Migraine No date: Migraines Past Surgical History: No date: ADENOIDECTOMY 07/19/2018: CHOLECYSTECTOMY; N/A     Comment:  Procedure: LAPAROSCOPIC CHOLECYSTECTOMY;  Surgeon:               Herbert Pun, MD;  Location: [facility] ORS;  Service:              General;  Laterality: N/A; No date: HIP SURGERY; Left   Reproductive/Obstetrics negative OB ROS                             Anesthesia Physical Anesthesia Plan  ASA: II  Anesthesia Plan: General ETT   Post-op Pain Management:  [facility] for Post-op pain   Induction:   PONV Risk Score and Plan:   Airway Management Planned:   Additional Equipment:   Intra-op Plan:   Post-operative Plan:   Informed Consent: I have reviewed the patients  History and Physical, chart, labs and discussed the procedure including the risks, benefits and alternatives for the proposed anesthesia with the patient or authorized representative who has indicated his/her understanding and acceptance.     Dental Advisory Given  Plan Discussed with: CRNA  Anesthesia Plan Comments:         Anesthesia Quick Evaluation

## 2018-11-09 NOTE — Op Note (Signed)
11/09/2018  10:30 AM  Patient:   Carl Solis  Pre-Op Diagnosis:  Osteonecrosis, right shoulder.  Post-Op Diagnosis:   Same.  Procedure:   right shoulder hemiarthroplasty.  Surgeon:   Kurtis Bushman, MD  Assistant:  Carlynn Spry, PA-C  Anesthesia:   General endotracheal intubation with an interscalene block.  Findings:   As above. The rotator cuff was in satisfactory condition.  Complications:   None  EBL:  30 cc  Drains:   None  Closure:   Staples  Implants:   [employer] Cap 48 mm by 18 mm Brief Clinical Note:   The patient is a 33 year old man with a history of drug induced bilateral shoulder osteonecrosis. The patient presents at this time for a right shoulder hemiarthroplasty cap.  Procedure:   The patient was brought into the operating room and lain in the supine position on the OR table. After adequate IV sedation was achieved, an interscalene block was placed by the anesthesiologist. The patient then underwent general endotracheal intubation and anesthesia before being repositioned in the beach chair position using the beach chair positioner. A Foley catheter was placed by the nurse. The right shoulder and upper extremity were prepped with ChloraPrep solution before being draped sterilely. Preoperative antibiotics were administered. A standard anterior approach to the shoulder was made through an approximately 4-5 inch incision. The incision was carried down through the subcutaneous tissues to expose the deltopectoral fascia. The interval between the deltoid and pectoralis muscles was identified and this plane developed, retracting the cephalic vein laterally with the deltoid muscle. The conjoined tendon was identified. The lateral margin was dissected and the Kolbel self-retraining retractor inserted. The "three sisters" were identified and cauterized. Bursal tissues were removed to improve visualization. The biceps tendon was sutured to the clavipectoral fascia and cut.  The subscapularis tendon was released from its attachment to the lesser tuberosity 1 cm proximal to its insertion and several tagging sutures placed. The inferior capsule was released with care after identifying and protecting the axillary nerve. The humeral head was measured and a guide pin placed in the center position. The reamer was introduced and the punch. Sclerotic bone was identified and multiple holes were drilled to induce healing.  The final components were impacted into place with care taken to maintain the appropriate version. Again, the [facility] taper locking mechanism was verified using manual distraction. The shoulder was relocated and again placed through a range of motion with the findings as described above.  The wound was copiously irrigated with bacitracin saline solution using the jet lavage system. The subscapularis tendon was reapproximated using 60mm cottony Dacron sutures. The deltopectoral interval was closed using #0 Vicryl interrupted sutures before the subcutaneous tissues were closed using 2-0 Vicryl interrupted sutures. The skin was closed using staples. A sterile occlusive dressing was applied to the wound before the arm was placed into a sling. The patient was then transferred back to a hospital bed before being awakened, extubated, and returned to the recovery room in satisfactory condition after tolerating the procedure well.

## 2018-11-09 NOTE — Anesthesia Procedure Notes (Signed)
Anesthesia Regional Block: Interscalene brachial plexus block   Pre-Anesthetic Checklist: ,, timeout performed, Correct Patient, Correct Site, Correct Laterality, Correct Procedure, Correct Position, site marked, Risks and benefits discussed,  Surgical consent,  Pre-op evaluation,  At surgeon's request and post-op pain management  Laterality: Right  Prep: chloraprep       Needles:  Injection technique: Single-shot  Needle Type: Stimiplex     Needle Length: 10cm  Needle Gauge: 20     Additional Needles:   Procedures:, nerve stimulator,,, ultrasound used (permanent image in chart),,,,   Nerve Stimulator or Paresthesia:  Response: biceps flexion,   Additional Responses:   Narrative:  Injection made incrementally with aspirations every 5 mL.  Performed by: Personally  Anesthesiologist: Molli Barrows, MD  Additional Notes: Functioning IV was confirmed and monitors were applied.Sterile prep and drape,hand hygiene and sterile gloves were used.  Negative aspiration and negative test dose prior to incremental administration of local anesthetic. The patient tolerated the procedure well.

## 2018-11-09 NOTE — Anesthesia Post-op Follow-up Note (Signed)
Anesthesia QCDR form completed.        

## 2018-11-09 NOTE — Transfer of Care (Signed)
Immediate Anesthesia Transfer of Care Note  Patient: Carl Solis  Procedure(s) Performed: RIGHT SHOULDER HEMIARTHROPLASTY (Right Shoulder)  Patient Location: PACU  Anesthesia Type:General  Level of Consciousness: sedated  Airway & Oxygen Therapy: Patient connected to nasal cannula oxygen and Patient connected to face mask oxygen  Post-op Assessment: Report given to RN and Post -op Vital signs reviewed and stable  Post vital signs: Reviewed and stable  Last Vitals:  Vitals Value Taken Time  BP 86/44 11/09/18 1045  Temp 36.2 C 11/09/18 1044  Pulse 76 11/09/18 1049  Resp 13 11/09/18 1049  SpO2 100 % 11/09/18 1049  Vitals shown include unvalidated device data.  Last Pain:  Vitals:   11/09/18 0741  TempSrc: Temporal  PainSc: 7       Patients Stated Pain Goal: 2 ([id number])  Complications: No apparent anesthesia complications

## 2018-11-09 NOTE — H&P (Signed)
The patient has been re-examined, and the chart reviewed, and there have been no interval changes to the documented history and physical.  Plan a right shoulder hemiarthroplasty today.  Anesthesia is consulted regarding a peripheral nerve block for post-operative pain.  The risks, benefits, and alternatives have been discussed at length, and the patient is willing to proceed.

## 2018-11-09 NOTE — Evaluation (Addendum)
Occupational Therapy Evaluation Patient Details Name: Carl Solis MRN: [id number] DOB: [birth year] Today's Date: 11/09/2018    History of Present Illness Pt is 33 y/o M with PMH: avascular necrosis, b/l hip replacements approximately 1 year ago, and migraines. Now presents with osteonecrosis d/t drugs requiring R shoulder hemi-arthroplasty.   Clinical Impression   Patient was seen for an OT evaluation this date. Pt lives with his spouse and mother. Prior to surgery, pt was active and independent. Pt has orders for R UE to be immobilized with sling and will be NWBing per MD. Patient presents with impaired strength/ROM, pain, and sensation to R UE with block not completely resolved yet. These impairments result in a decreased ability to perform self care tasks requiring MIN A for UB/LB dressing and bathing and MIN A for application of ice packs and sling/immobilizer. Pt instructed in ice pack mgt, sling/immobilizer mgt, ROM exercises for R UE (with instructions for no elbow exercise until full sensation has returned), can ROM wrist and hand ad lib, R UE precautions, adaptive strategies for bathing/dressing/toileting/grooming (hemi dressing technique), positioning and considerations for sleep, and home/routines modifications to maximize falls prevention, safety, and independence. Handout provided. OT adjusted sling/immobilizer to improve comfort, optimize positioning, and to maximize skin integrity/safety. Pt verbalized understanding of all education/training provided. Pt will benefit from skilled OT services to address these limitations and improve independence in daily tasks. Recommend Outpt OT services (addended 16:21 on 11/09/2018) to continue therapy to maximize return to PLOF, address home/routines modifications and safety, minimize falls risk, and minimize caregiver burden.      Follow Up Recommendations  Outpatient OT (Addended 16:21 on 11/09/2018)   Equipment Recommendations  None  recommended by OT    Recommendations for Other Services       Precautions / Restrictions Precautions Precautions: Shoulder Type of Shoulder Precautions: sling @ all times except when needed for activity/ADLs, no shoulder AROM/PROM, AROM of elbow, wrist, hands okay. Shoulder Interventions: Shoulder sling/immobilizer Precaution Comments: shoulder protocol handout issued Restrictions Weight Bearing Restrictions: Yes RUE Weight Bearing: Non weight bearing      Mobility Bed Mobility Overal bed mobility: Independent                Transfers Overall transfer level: Needs assistance   Transfers: Sit to/from Stand Sit to Stand: Supervision              Balance Overall balance assessment: Independent;No apparent balance deficits (not formally assessed)                                         ADL either performed or assessed with clinical judgement   ADL Overall ADL's : Needs assistance/impaired Eating/Feeding: Set up;Modified independent;Sitting   Grooming: Wash/dry face;Set up;Modified independent   Upper Body Bathing: Minimal assistance;Sitting   Lower Body Bathing: Set up;Sit to/from stand   Upper Body Dressing : Minimal assistance;Set up;Sitting   Lower Body Dressing: Minimal assistance;Sit to/from stand   Toilet Transfer: Supervision/safety   Toileting- Water quality scientist and Hygiene: Supervision/safety;Modified independent;Sit to/from stand   Tub/ Shower Transfer: Supervision/safety   Functional mobility during ADLs: Supervision/safety General ADL Comments: supervision with fxl mobility and t/f's on evaluation for pt safety as he is POD #0, no LOB, demos appropriate safety awareness.     Vision Baseline Vision/History: Wears glasses Wears Glasses: At all times Patient Visual Report: No change from baseline  Perception     Praxis      Pertinent Vitals/Pain Pain Assessment: 0-10 Pain Score: 8  Pain Location: L  shoulder/non-op shoulder pain (R shoulder still numb from block at time of evaluation Pain Descriptors / Indicators: Aching;Sore Pain Intervention(s): Limited activity within patient's tolerance;Monitored during session;RN gave pain meds during session     Hand Dominance     Extremity/Trunk Assessment Upper Extremity Assessment Upper Extremity Assessment: RUE deficits/detail;LUE deficits/detail RUE: Unable to fully assess due to pain;Unable to fully assess due to immobilization RUE Sensation: decreased proprioception(block has not worn off at time of assessment.) LUE Deficits / Details: WNL, grossly >3/5, grips 5/5   Lower Extremity Assessment Lower Extremity Assessment: Overall WFL for tasks assessed   Cervical / Trunk Assessment Cervical / Trunk Assessment: Normal   Communication Communication Communication: No difficulties   Cognition Arousal/Alertness: Awake/alert Behavior During Therapy: WFL for tasks assessed/performed Overall Cognitive Status: Within Functional Limits for tasks assessed                                     General Comments       Exercises Other Exercises Other Exercises: OT facilitates pt education re: precautions including issuing copy of shoulder protocol handout. Mom is present in room with pt and asks appropriate questions. Topics addressed include showering, ice packs, precuations for ROM, NWB status, dressing and sling use. Pt and mom verbalize understanding. Pt simulates understanding of hemi-dressing technique, but cannot fully participate as his block has not completely worn off at time of evaluation.   Shoulder Instructions      Home Living Family/patient expects to be discharged to:: Private residence Living Arrangements: Spouse/significant other;Parent Available Help at Discharge: Family Type of Home: House             Bathroom Shower/Tub: Tub/shower unit   Bathroom Toilet: Standard     Home Equipment: Environmental consultant - 2  wheels          Prior Functioning/Environment Level of Independence: Independent        Comments: has to modify several ADL tasks d/t shoulder pain at baseline including UB dressing.        OT Problem List: Decreased range of motion;Decreased activity tolerance;Pain      OT Treatment/Interventions: Self-care/ADL training;Therapeutic exercise;Therapeutic activities;Patient/family education    OT Goals(Current goals can be found in the care plan section) Acute Rehab OT Goals Patient Stated Goal: to have less pain and go home OT Goal Formulation: With patient/family Time For Goal Achievement: 11/23/18 Potential to Achieve Goals: Good  OT Frequency: Min 1X/week   Barriers to D/C:            Co-evaluation              AM-PAC OT "6 Clicks" Daily Activity     Outcome Measure Help from another person eating meals?: None Help from another person taking care of personal grooming?: A Little Help from another person toileting, which includes using toliet, bedpan, or urinal?: A Little Help from another person bathing (including washing, rinsing, drying)?: A Little Help from another person to put on and taking off regular upper body clothing?: A Little Help from another person to put on and taking off regular lower body clothing?: A Little 6 Click Score: 19   End of Session Equipment Utilized During Treatment: Gait belt Nurse Communication: (issued shoulder protocol)  Activity Tolerance: Patient tolerated treatment well  Patient left: in bed;with call bell/phone within reach;with family/visitor present  OT Visit Diagnosis: Pain Pain - Right/Left: Right Pain - part of body: Shoulder                Time: 1321-1400 OT Time Calculation (min): 39 min Charges:  OT General Charges $OT Visit: 1 Visit OT Evaluation $OT Eval Low Complexity: 1 Low OT Treatments $Self Care/Home Management : 8-22 mins $Therapeutic Activity: 8-22 mins  Gerrianne Scale, MS, OTR/L ascom  [phone] or [phone] 11/09/18, 4:12 PM

## 2018-11-09 NOTE — Anesthesia Procedure Notes (Signed)
Procedure Name: Intubation Date/Time: 11/09/2018 8:39 AM Performed by: Justus Memory, CRNA Pre-anesthesia Checklist: Patient identified, Patient being monitored, Timeout performed, Emergency Drugs available and Suction available Patient Re-evaluated:Patient Re-evaluated prior to induction Oxygen Delivery Method: Circle system utilized Preoxygenation: Pre-oxygenation with 100% oxygen Induction Type: IV induction Ventilation: Mask ventilation without difficulty Laryngoscope Size: Mac and 3 Grade View: Grade II Tube type: Oral Tube size: 7.0 mm Number of attempts: 1 Airway Equipment and Method: Stylet Placement Confirmation: ETT inserted through vocal cords under direct vision,  positive ETCO2 and breath sounds checked- equal and bilateral Secured at: 22 cm Tube secured with: Tape Dental Injury: Teeth and Oropharynx as per pre-operative assessment

## 2018-11-10 ENCOUNTER — Telehealth: Payer: Self-pay | Admitting: Anesthesiology

## 2018-11-10 NOTE — Progress Notes (Signed)
Patient discharged , medications explained to patient, verbalized understanding, transported via w/c without incident with Mom to her personal vehicle.

## 2018-11-10 NOTE — Telephone Encounter (Signed)
Spoke with [facility] rep and she will pass along that it is okay to manage post operative pain.

## 2018-11-10 NOTE — Discharge Instructions (Signed)
Wear sling at all times, including sleep.  You will need to use the sling for a total of 4 weeks following surgery. ° °Do not try and lift your arm up or away from your body for any reason.  ° °Keep the dressing dry.  You may remove bandage in 3 days.  You may place a Band-Aid over top of the incision. ° °May shower once dressing is removed in 3 days.  Remove sling carefully only for showers, leaving arm down by your side while in the shower. ° °+++ Make sure to take some pain medication this evening before you fall asleep, in preparation for the nerve block wearing off in the middle of the night. ° °If the the pain medication causes itching, or is too strong, try taking a single tablet at a time, or combining with Benadryl. ° °You may be most comfortable sleeping in a recliner.  If you do sleep in near bed, placed pillows behind the shoulder that have the operation to support it. °

## 2018-11-10 NOTE — Telephone Encounter (Signed)
Spoke with patient's mom today.  [name] had surgery for a joint replacement today and when released from hospital was told to use his chronic pain medication since he was under pain management.  I explained that with any acute process or surgical process it is completely acceptable for the provider to prescribe for pain relief.  Chronic pain medication is not handling the pain.

## 2018-11-10 NOTE — Telephone Encounter (Signed)
Patient's mother called stating patient had shoulder replace 11-09-18 and was told to use his reg pain meds. His pain meds are not helping. Please call as soon as possible.

## 2018-11-10 NOTE — Discharge Summary (Signed)
Physician Discharge Summary  Patient ID: Carl Solis MRN: [id number] DOB/AGE: [birth year] 33 y.o.  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 Avascular necrosis ([place])   . Avascular necrosis ([place]) 08/02/2018  . Chronic pain syndrome 08/02/2018  . Chronic, continuous use of opioids 08/02/2018  . Hip pain, bilateral 08/02/2018  . History of kidney stones   . Migraine   . Migraines     Surgeries: Procedure(s): RIGHT SHOULDER HEMIARTHROPLASTY on 11/09/2018   Consultants (if any):   Discharged Condition: Improved  Hospital Course: Carl Solis is an 33 y.o. 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    He was given perioperative antibiotics:  Anti-infectives (From admission, onward)   Start     Dose/Rate Route Frequency Ordered Stop   11/09/18 1430  clindamycin (CLEOCIN) IVPB 600 mg     600 mg 100 mL/hr over 30 Minutes Intravenous Every 6 hours 11/09/18 1202 11/10/18 0642   11/09/18 0746  clindamycin (CLEOCIN) 900 MG/50ML IVPB    Note to Pharmacy: Register, Karen   : cabinet override      11/09/18 0746 11/09/18 0840   11/09/18 0600  clindamycin (CLEOCIN) IVPB 900 mg     900 mg 100 mL/hr over 30 Minutes Intravenous On call to O.R. 11/08/18 2307 11/09/18 0845    .  He was given sequential compression devices and early ambulation  for DVT prophylaxis.  He benefited maximally from the hospital stay and there were no complications.    Recent vital signs:  Vitals:   11/10/18 0443  11/10/18 0809  BP: (!) 100/58 106/68  Pulse: 73 77  Resp: 18 18  Temp: 98.2 F (36.8 C) 98.5 F (36.9 C)  SpO2: 99% 99%    Recent laboratory studies:  Lab Results  Component Value Date   HGB 14.5 11/04/2018   HGB 15.5 07/19/2018   HGB 15.3 11/22/2017   Lab Results  Component Value Date   WBC 11.3 (H) 11/04/2018   PLT 186 11/04/2018   Lab Results  Component Value Date   INR 1.0 11/04/2018   Lab Results  Component Value Date   NA 139 11/04/2018   K 4.1 11/04/2018   CL 100 11/04/2018   CO2 31 11/04/2018   BUN 8 11/04/2018   CREATININE 1.12 11/04/2018   GLUCOSE 91 11/04/2018    Discharge Medications:   Allergies as of 11/10/2018      Reactions   D.h.e. [dihydroergotamine] Swelling, Anaphylaxis   Lack Jaw, Rash, Hives   Latex Hives, Rash   Amoxicillin Diarrhea   Bee Venom    Dhea    Other reaction(s): Other (See Comments) "retain water in legs which prevents me from walking"   Eletriptan    Other reaction(s): Other (See Comments) "lock jaw where I can't eat or drink"   Pork-derived Products    Other reaction(s): Headache   Rizatriptan    Other reaction(s): Other (See Comments) Lock jaw, rash, hives   Sulfa Antibiotics Diarrhea   Sulfacetamide Sodium Diarrhea   Sumatriptan Other (See Comments)   "lock jaw where I  can't eat or drink"   Toradol [ketorolac Tromethamine]    Triptans    Aspartame And Phenylalanine    headaches   Bacitracin-neomycin-polymyxin Hives, Rash   Ketorolac Hives, Rash   Neosporin [neomycin-bacitracin Zn-polymyx] Rash      Medication List    TAKE these medications   albuterol 108 (90 Base) MCG/ACT inhaler Commonly known as: VENTOLIN HFA Inhale 2 puffs into the lungs every 6 (six) hours as needed for wheezing or shortness of breath.   ARIPiprazole 5 MG tablet Commonly known as: ABILIFY Take 5 mg by mouth daily.   candesartan 8 MG tablet Commonly known as: ATACAND Take 8 mg by mouth daily.   cetirizine 10 MG  tablet Commonly known as: ZYRTEC Take 10 mg by mouth daily.   citalopram 40 MG tablet Commonly known as: CELEXA Take 40 mg by mouth daily.   diazepam 5 MG tablet Commonly known as: VALIUM Take 5 mg by mouth every 8 (eight) hours as needed for anxiety.   diclofenac sodium 1 % Gel Commonly known as: VOLTAREN Apply 2 g topically 4 (four) times daily as needed (shoulder pain).   EPINEPHrine 0.3 mg/0.3 mL Soaj injection Commonly known as: EPI-PEN Inject 0.3 mLs into the skin as needed for anaphylaxis.   fluticasone 50 MCG/ACT nasal spray Commonly known as: FLONASE Place 2 sprays into both nostrils daily.   HYDROmorphone 4 MG tablet Commonly known as: Dilaudid Take 1 tablet (4 mg total) by mouth every 4 (four) hours as needed for severe pain.   ondansetron 4 MG disintegrating tablet Commonly known as: Zofran ODT Take 1 tablet (4 mg total) by mouth every 8 (eight) hours as needed for nausea or vomiting.   pantoprazole 40 MG tablet Commonly known as: PROTONIX Take 40 mg by mouth 2 (two) times daily.   QUEtiapine 25 MG tablet Commonly known as: SEROQUEL Take 75 mg by mouth daily as needed (onset of migraine).   QUEtiapine 100 MG tablet Commonly known as: SEROQUEL Take 100 mg by mouth at bedtime as needed (persistent migraine).   Ubrelvy 50 MG Tabs Generic drug: Ubrogepant Take 100 mg by mouth daily as needed (migraine).       Diagnostic Studies: Dg Shoulder Right Port  Result Date: 11/09/2018 CLINICAL DATA:  Right shoulder hemiarthroplasty. EXAM: PORTABLE RIGHT SHOULDER COMPARISON:  None. FINDINGS: Three-view exam shows the patient to be status post right shoulder hemiarthroplasty. No evidence for immediate hardware complications. No pneumothorax in the visualized right hemithorax. Skin staples overlie the shoulder. IMPRESSION: Status post right shoulder replacement without complicating features. Electronically Signed   By: Misty Stanley M.D.   On: 11/09/2018 13:28   [place]  Or Nerve Block-image Only ([facility])  Result Date: 11/09/2018 There is no interpretation for this exam.  This order is for images obtained during a surgical procedure.  Please See "Surgeries" Tab for more information regarding the procedure.    Disposition: Discharge disposition: 01-Home or Self Care            Signed: Lovell Sheehan ,MD 11/10/2018, 12:45 PM

## 2018-11-11 ENCOUNTER — Encounter: Payer: Self-pay | Admitting: Anesthesiology

## 2018-11-11 ENCOUNTER — Encounter: Payer: Self-pay | Admitting: Orthopedic Surgery

## 2018-11-11 NOTE — Anesthesia Postprocedure Evaluation (Signed)
Anesthesia Post Note  Patient: Friddie Clowes  Procedure(s) Performed: RIGHT SHOULDER HEMIARTHROPLASTY (Right Shoulder)  Patient location during evaluation: PACU Anesthesia Type: General Level of consciousness: awake and alert Pain management: pain level controlled Vital Signs Assessment: post-procedure vital signs reviewed and stable Respiratory status: spontaneous breathing, nonlabored ventilation, respiratory function stable and patient connected to nasal cannula oxygen Cardiovascular status: blood pressure returned to baseline and stable Postop Assessment: no apparent nausea or vomiting Anesthetic complications: no     Last Vitals:  Vitals:   11/10/18 0809 11/10/18 1322  BP: 106/68 101/68  Pulse: 77 86  Resp: 18   Temp: 36.9 C 37.1 C  SpO2: 99% 100%    Last Pain:  Vitals:   11/10/18 1322  TempSrc: Oral  PainSc:                  Molli Barrows

## 2018-11-28 ENCOUNTER — Ambulatory Visit: Payer: BC Managed Care – PPO | Attending: Anesthesiology | Admitting: Anesthesiology

## 2018-11-28 ENCOUNTER — Encounter: Payer: Self-pay | Admitting: Anesthesiology

## 2018-11-28 ENCOUNTER — Other Ambulatory Visit: Payer: Self-pay

## 2018-11-28 DIAGNOSIS — G44221 Chronic tension-type headache, intractable: Secondary | ICD-10-CM

## 2018-11-28 DIAGNOSIS — M25551 Pain in right hip: Secondary | ICD-10-CM | POA: Diagnosis not present

## 2018-11-28 DIAGNOSIS — M25552 Pain in left hip: Secondary | ICD-10-CM

## 2018-11-28 DIAGNOSIS — M19011 Primary osteoarthritis, right shoulder: Secondary | ICD-10-CM

## 2018-11-28 DIAGNOSIS — F1190 Opioid use, unspecified, uncomplicated: Secondary | ICD-10-CM | POA: Diagnosis not present

## 2018-11-28 DIAGNOSIS — M8700 Idiopathic aseptic necrosis of unspecified bone: Secondary | ICD-10-CM

## 2018-11-28 DIAGNOSIS — G894 Chronic pain syndrome: Secondary | ICD-10-CM

## 2018-11-28 DIAGNOSIS — M19012 Primary osteoarthritis, left shoulder: Secondary | ICD-10-CM

## 2018-11-28 DIAGNOSIS — G43111 Migraine with aura, intractable, with status migrainosus: Secondary | ICD-10-CM

## 2018-11-28 MED ORDER — HYDROMORPHONE HCL 4 MG PO TABS: 4.0000 mg | ORAL_TABLET | ORAL | 0 refills | Status: DC | PRN

## 2018-11-28 MED ORDER — HYDROMORPHONE HCL 4 MG PO TABS: 4.0000 mg | ORAL_TABLET | ORAL | 0 refills | Status: AC | PRN

## 2018-11-28 NOTE — Progress Notes (Signed)
Virtual Visit via Video Note  I connected with Carl Solis on 11/28/18 at  2:15 PM EDT by a video enabled telemedicine application and verified that I am speaking with the correct person using two identifiers.  Location: Patient: Home Provider: Pain control center   I discussed the limitations of evaluation and management by telemedicine and the availability of in person appointments. The patient expressed understanding and agreed to proceed.  History of Present Illness: I spoke with Carl Solis regarding his diffuse body pain shoulders and hip pain following his recent shoulder replacement surgery.  This was done via virtual conferencing today and he states that he has been doing well with his medications.  Dr. Harlow Mares assisted with the acute component of his pain control and he was also using some of his chronic meds for the acute component as directed by Dr. Harlow Mares.  He is doing well with medications but will be due for refill in 2 days.  Otherwise he has been doing well with his diffuse body pain and no other changes are noted.  No side effects with the medications are reported.  The quality characteristic and distribution of the pain is otherwise at baseline as well.  He is doing his rehab physical therapy for his shoulder as requested    Observations/Objective:Marland Kitchen Current Outpatient Medications:  .  albuterol (VENTOLIN HFA) 108 (90 Base) MCG/ACT inhaler, Inhale 2 puffs into the lungs every 6 (six) hours as needed for wheezing or shortness of breath., Disp: , Rfl:  .  ARIPiprazole (ABILIFY) 5 MG tablet, Take 5 mg by mouth daily., Disp: , Rfl:  .  candesartan (ATACAND) 8 MG tablet, Take 8 mg by mouth daily. , Disp: , Rfl:  .  cetirizine (ZYRTEC) 10 MG tablet, Take 10 mg by mouth daily., Disp: , Rfl:  .  citalopram (CELEXA) 40 MG tablet, Take 40 mg by mouth daily. , Disp: , Rfl:  .  diazepam (VALIUM) 5 MG tablet, Take 5 mg by mouth every 8 (eight) hours as needed for anxiety. , Disp: ,  Rfl:  .  diclofenac sodium (VOLTAREN) 1 % GEL, Apply 2 g topically 4 (four) times daily as needed (shoulder pain)., Disp: , Rfl:  .  EPINEPHrine 0.3 mg/0.3 mL IJ SOAJ injection, Inject 0.3 mLs into the skin as needed for anaphylaxis. , Disp: , Rfl:  .  fluticasone (FLONASE) 50 MCG/ACT nasal spray, Place 2 sprays into both nostrils daily. , Disp: , Rfl:  .  [START ON 11/30/2018] HYDROmorphone (DILAUDID) 4 MG tablet, Take 1 tablet (4 mg total) by mouth every 4 (four) hours as needed for severe pain., Disp: 150 tablet, Rfl: 0 .  [START ON 12/30/2018] HYDROmorphone (DILAUDID) 4 MG tablet, Take 1 tablet (4 mg total) by mouth every 4 (four) hours as needed for severe pain., Disp: 150 tablet, Rfl: 0 .  ondansetron (ZOFRAN ODT) 4 MG disintegrating tablet, Take 1 tablet (4 mg total) by mouth every 8 (eight) hours as needed for nausea or vomiting., Disp: 20 tablet, Rfl: 0 .  pantoprazole (PROTONIX) 40 MG tablet, Take 40 mg by mouth 2 (two) times daily. , Disp: , Rfl:  .  QUEtiapine (SEROQUEL) 100 MG tablet, Take 100 mg by mouth at bedtime as needed (persistent migraine). , Disp: , Rfl:  .  QUEtiapine (SEROQUEL) 25 MG tablet, Take 75 mg by mouth daily as needed (onset of migraine). , Disp: , Rfl:  .  Ubrogepant (UBRELVY) 50 MG TABS, Take 100 mg by mouth daily  as needed (migraine)., Disp: , Rfl:    Assessment and Plan: 1. Hip pain, bilateral   2. Chronic, continuous use of opioids   3. Chronic pain syndrome   4. Osteoarthritis of both shoulders, unspecified osteoarthritis type   5. Avascular necrosis of bone ([place])   6. Intractable migraine with aura with status migrainosus   7. Chronic tension-type headache, intractable   As discussed with Carl Solis today and upon review of the [facility] practitioner database information going to refill his medications.  He will be a few days short today and refills will be given for October 28 and November 27 for 150 of the Dilaudid tablets.  This regimen has been  working well for him.  He shown no evidence of any diverting or illicit use and he has been compliant.  He is also getting good relief with the medications and we will make these refills today.  He is scheduled for  return to clinic for reevaluation in 2 months.  Follow Up Instructions:    I discussed the assessment and treatment plan with the patient. The patient was provided an opportunity to ask questions and all were answered. The patient agreed with the plan and demonstrated an understanding of the instructions.   The patient was advised to call back or seek an in-person evaluation if the symptoms worsen or if the condition fails to improve as anticipated.  I provided 30 minutes of non-face-to-face time during this encounter.   Molli Barrows, MD

## 2018-11-30 ENCOUNTER — Encounter: Payer: Self-pay | Admitting: Anesthesiology

## 2018-12-21 ENCOUNTER — Encounter: Payer: Self-pay | Admitting: Anesthesiology

## 2018-12-27 NOTE — Addendum Note (Signed)
Addended by: Molli Barrows on: 12/27/2018 01:20 PM   Modules accepted: Orders

## 2019-01-02 ENCOUNTER — Encounter: Payer: Self-pay | Admitting: Anesthesiology

## 2019-01-11 ENCOUNTER — Encounter: Payer: Self-pay | Admitting: *Deleted

## 2019-01-11 ENCOUNTER — Telehealth: Payer: Self-pay | Admitting: Anesthesiology

## 2019-01-11 NOTE — Telephone Encounter (Signed)
Written order faxed.

## 2019-01-11 NOTE — Telephone Encounter (Signed)
Referral has been sent to [facility]. The patient called asking about appt. I called [facility] and it will expedite the process if they can get an order for Tens Unit. Please ask Dr. Andree Elk to put in order for Tens Unit for hip and shoulder pain.

## 2019-01-17 ENCOUNTER — Ambulatory Visit: Payer: BC Managed Care – PPO | Attending: Anesthesiology | Admitting: Anesthesiology

## 2019-01-17 ENCOUNTER — Other Ambulatory Visit: Payer: Self-pay

## 2019-01-17 ENCOUNTER — Encounter: Payer: Self-pay | Admitting: Anesthesiology

## 2019-01-17 DIAGNOSIS — M8700 Idiopathic aseptic necrosis of unspecified bone: Secondary | ICD-10-CM

## 2019-01-17 DIAGNOSIS — F1190 Opioid use, unspecified, uncomplicated: Secondary | ICD-10-CM | POA: Diagnosis not present

## 2019-01-17 DIAGNOSIS — M25552 Pain in left hip: Secondary | ICD-10-CM

## 2019-01-17 DIAGNOSIS — M25551 Pain in right hip: Secondary | ICD-10-CM

## 2019-01-17 DIAGNOSIS — G894 Chronic pain syndrome: Secondary | ICD-10-CM | POA: Diagnosis not present

## 2019-01-17 DIAGNOSIS — M19011 Primary osteoarthritis, right shoulder: Secondary | ICD-10-CM

## 2019-01-17 DIAGNOSIS — M19012 Primary osteoarthritis, left shoulder: Secondary | ICD-10-CM

## 2019-01-17 MED ORDER — HYDROMORPHONE HCL 4 MG PO TABS: 4.0000 mg | ORAL_TABLET | ORAL | 0 refills | Status: DC | PRN

## 2019-01-17 MED ORDER — HYDROMORPHONE HCL 4 MG PO TABS: 4.0000 mg | ORAL_TABLET | ORAL | 0 refills | Status: AC | PRN

## 2019-01-17 NOTE — Progress Notes (Signed)
Virtual Visit via Video Note  I connected with Carl Solis on 01/17/19 at  1:45 PM EST by a video enabled telemedicine application and verified that I am speaking with the correct person using two identifiers.  Location: Patient: Home Provider: Pain control center   I discussed the limitations of evaluation and management by telemedicine and the availability of in person appointments. The patient expressed understanding and agreed to proceed.  History of Present Illness: I spoke to Carl Solis via video conferencing today for his virtual return appointment.  He states that he is doing well following his recent shoulder surgery but continues to have significant pain in that shoulder.  He is yet to have surgery on the other shoulder and continues to have bilateral hip pain and intermittent low back pain.  The quality characteristic and distribution of the pain remained stable in nature with no significant changes reported today.  He is taking his Dilaudid approximately 5 times a day and this is working well.  No side effects reported and he is getting good relief.  Otherwise he is in his usual state of health at this time.  He is continuing to do some stretching strengthening exercises with limited relief of his pain.    Observations/Objective:  Current Outpatient Medications:  .  albuterol (VENTOLIN HFA) 108 (90 Base) MCG/ACT inhaler, Inhale 2 puffs into the lungs every 6 (six) hours as needed for wheezing or shortness of breath., Disp: , Rfl:  .  ARIPiprazole (ABILIFY) 5 MG tablet, Take 5 mg by mouth daily., Disp: , Rfl:  .  candesartan (ATACAND) 8 MG tablet, Take 8 mg by mouth daily. , Disp: , Rfl:  .  cetirizine (ZYRTEC) 10 MG tablet, Take 10 mg by mouth daily., Disp: , Rfl:  .  citalopram (CELEXA) 40 MG tablet, Take 40 mg by mouth daily. , Disp: , Rfl:  .  diazepam (VALIUM) 5 MG tablet, Take 5 mg by mouth every 8 (eight) hours as needed for anxiety. , Disp: , Rfl:  .  diclofenac  sodium (VOLTAREN) 1 % GEL, Apply 2 g topically 4 (four) times daily as needed (shoulder pain)., Disp: , Rfl:  .  EPINEPHrine 0.3 mg/0.3 mL IJ SOAJ injection, Inject 0.3 mLs into the skin as needed for anaphylaxis. , Disp: , Rfl:  .  fluticasone (FLONASE) 50 MCG/ACT nasal spray, Place 2 sprays into both nostrils daily. , Disp: , Rfl:  .  [START ON 02/02/2019] HYDROmorphone (DILAUDID) 4 MG tablet, Take 1 tablet (4 mg total) by mouth every 4 (four) hours as needed for severe pain., Disp: 150 tablet, Rfl: 0 .  [START ON 03/04/2019] HYDROmorphone (DILAUDID) 4 MG tablet, Take 1 tablet (4 mg total) by mouth every 4 (four) hours as needed for severe pain., Disp: 150 tablet, Rfl: 0 .  ondansetron (ZOFRAN ODT) 4 MG disintegrating tablet, Take 1 tablet (4 mg total) by mouth every 8 (eight) hours as needed for nausea or vomiting., Disp: 20 tablet, Rfl: 0 .  pantoprazole (PROTONIX) 40 MG tablet, Take 40 mg by mouth 2 (two) times daily. , Disp: , Rfl:  .  QUEtiapine (SEROQUEL) 100 MG tablet, Take 100 mg by mouth at bedtime as needed (persistent migraine). , Disp: , Rfl:  .  QUEtiapine (SEROQUEL) 25 MG tablet, Take 75 mg by mouth daily as needed (onset of migraine). , Disp: , Rfl:  .  Ubrogepant (UBRELVY) 50 MG TABS, Take 100 mg by mouth daily as needed (migraine)., Disp: , Rfl:  Assessment and Plan: 1. Hip pain, bilateral   2. Chronic, continuous use of opioids   3. Chronic pain syndrome   4. Avascular necrosis of bone ([place])   5. Osteoarthritis of both shoulders, unspecified osteoarthritis type   Based on our discussion today and upon review of the [facility] practitioner database information I am going to refill his medications for December 31 and January 30.  We will schedule him for 56-month return to clinic.  I want him to continue follow-up with his primary care physicians for his baseline medical care.  He is to continue with the stretching strengthening exercises and to contact us at the pain control  center should he have any problems with his pain management.  Follow Up Instructions:    I discussed the assessment and treatment plan with the patient. The patient was provided an opportunity to ask questions and all were answered. The patient agreed with the plan and demonstrated an understanding of the instructions.   The patient was advised to call back or seek an in-person evaluation if the symptoms worsen or if the condition fails to improve as anticipated.  I provided 30 minutes of non-face-to-face time during this encounter.   Molli Barrows, MD

## 2019-03-21 ENCOUNTER — Ambulatory Visit: Payer: BC Managed Care – PPO | Attending: Anesthesiology | Admitting: Anesthesiology

## 2019-03-21 ENCOUNTER — Encounter: Payer: Self-pay | Admitting: Anesthesiology

## 2019-03-21 ENCOUNTER — Other Ambulatory Visit: Payer: Self-pay

## 2019-03-21 DIAGNOSIS — M25512 Pain in left shoulder: Secondary | ICD-10-CM

## 2019-03-21 DIAGNOSIS — M19011 Primary osteoarthritis, right shoulder: Secondary | ICD-10-CM

## 2019-03-21 DIAGNOSIS — G43111 Migraine with aura, intractable, with status migrainosus: Secondary | ICD-10-CM

## 2019-03-21 DIAGNOSIS — M8700 Idiopathic aseptic necrosis of unspecified bone: Secondary | ICD-10-CM | POA: Diagnosis not present

## 2019-03-21 DIAGNOSIS — M25551 Pain in right hip: Secondary | ICD-10-CM

## 2019-03-21 DIAGNOSIS — M19012 Primary osteoarthritis, left shoulder: Secondary | ICD-10-CM

## 2019-03-21 DIAGNOSIS — G894 Chronic pain syndrome: Secondary | ICD-10-CM | POA: Diagnosis not present

## 2019-03-21 DIAGNOSIS — F1190 Opioid use, unspecified, uncomplicated: Secondary | ICD-10-CM | POA: Diagnosis not present

## 2019-03-21 DIAGNOSIS — M25552 Pain in left hip: Secondary | ICD-10-CM

## 2019-03-21 DIAGNOSIS — M25511 Pain in right shoulder: Secondary | ICD-10-CM

## 2019-03-21 MED ORDER — NALOXONE HCL 4 MG/0.1ML NA LIQD: NASAL | 1 refills | Status: DC

## 2019-03-21 MED ORDER — HYDROMORPHONE HCL 4 MG PO TABS: 4.0000 mg | ORAL_TABLET | ORAL | 0 refills | Status: DC | PRN

## 2019-03-21 MED ORDER — HYDROMORPHONE HCL 4 MG PO TABS: 4.0000 mg | ORAL_TABLET | ORAL | 0 refills | Status: AC | PRN

## 2019-03-21 NOTE — Progress Notes (Signed)
Virtual Visit via Video Note  I connected with Carl Solis on 03/21/19 at  1:15 PM EST by a video enabled telemedicine application and verified that I am speaking with the correct person using two identifiers.  Location: Patient: Home Provider: Pain control center   I discussed the limitations of evaluation and management by telemedicine and the availability of in person appointments. The patient expressed understanding and agreed to proceed.  History of Present Illness: I spoke with Carl Solis via video conferencing for his virtual visit.  He reports that his low back pain and hip pain have been stable in nature.  He still having considerable right shoulder pain and his left shoulder has been problematic as well.  He is doing his physical therapy with limited success and persistent shoulder pain and a pain syndrome that is stable in nature secondary to his avascular necrosis.  He is taking his Dilaudid as prescribed and this is working well for him.  He denies any diverting or illicit use or problems with the medication without side effects noted.  He is try to do some stretching strengthening exercises as tolerated but otherwise is as per baseline.  No other changes are reported.  Observations/Objective  Current Outpatient Medications:  .  albuterol (VENTOLIN HFA) 108 (90 Base) MCG/ACT inhaler, Inhale 2 puffs into the lungs every 6 (six) hours as needed for wheezing or shortness of breath., Disp: , Rfl:  .  ARIPiprazole (ABILIFY) 5 MG tablet, Take 5 mg by mouth daily., Disp: , Rfl:  .  candesartan (ATACAND) 8 MG tablet, Take 8 mg by mouth daily. , Disp: , Rfl:  .  cetirizine (ZYRTEC) 10 MG tablet, Take 10 mg by mouth daily., Disp: , Rfl:  .  citalopram (CELEXA) 40 MG tablet, Take 40 mg by mouth daily. , Disp: , Rfl:  .  diazepam (VALIUM) 5 MG tablet, Take 5 mg by mouth every 8 (eight) hours as needed for anxiety. , Disp: , Rfl:  .  diclofenac sodium (VOLTAREN) 1 % GEL, Apply 2 g  topically 4 (four) times daily as needed (shoulder pain)., Disp: , Rfl:  .  EPINEPHrine 0.3 mg/0.3 mL IJ SOAJ injection, Inject 0.3 mLs into the skin as needed for anaphylaxis. , Disp: , Rfl:  .  fluticasone (FLONASE) 50 MCG/ACT nasal spray, Place 2 sprays into both nostrils daily. , Disp: , Rfl:  .  [START ON 04/04/2019] HYDROmorphone (DILAUDID) 4 MG tablet, Take 1 tablet (4 mg total) by mouth every 4 (four) hours as needed for severe pain., Disp: 150 tablet, Rfl: 0 .  [START ON 05/04/2019] HYDROmorphone (DILAUDID) 4 MG tablet, Take 1 tablet (4 mg total) by mouth every 4 (four) hours as needed for severe pain., Disp: 150 tablet, Rfl: 0 .  naloxone (NARCAN) nasal spray 4 mg/0.1 mL, As directed for opioid induced respiratory depression, Disp: 1 each, Rfl: 1 .  ondansetron (ZOFRAN ODT) 4 MG disintegrating tablet, Take 1 tablet (4 mg total) by mouth every 8 (eight) hours as needed for nausea or vomiting., Disp: 20 tablet, Rfl: 0 .  pantoprazole (PROTONIX) 40 MG tablet, Take 40 mg by mouth 2 (two) times daily. , Disp: , Rfl:  .  QUEtiapine (SEROQUEL) 100 MG tablet, Take 100 mg by mouth at bedtime as needed (persistent migraine). , Disp: , Rfl:  .  QUEtiapine (SEROQUEL) 25 MG tablet, Take 75 mg by mouth daily as needed (onset of migraine). , Disp: , Rfl:  .  Ubrogepant (UBRELVY) 50 MG  TABS, Take 100 mg by mouth daily as needed (migraine)., Disp: , Rfl:  Assessment and Plan: 1. Hip pain, bilateral   2. Chronic, continuous use of opioids   3. Chronic pain syndrome   4. Avascular necrosis of bone ([place])   5. Osteoarthritis of both shoulders, unspecified osteoarthritis type   6. Intractable migraine with aura with status migrainosus   7. Pain of both shoulder joints   Based on our discussion today upon review of the [facility] practitioner database information I think it is appropriate to refill his medications.  We will keep our current regimen and force.  This will be dated for March 2 and April 1.  1  continue with the stretching strengthening exercises and continue follow-up with his orthopedic doctors for his avascular necrosis.  Continue follow-up with his primary care physicians and should he have any problems with his pain management to contact us at the pain control center.  We will schedule for a 72-month return to clinic. Follow Up Instructions:    I discussed the assessment and treatment plan with the patient. The patient was provided an opportunity to ask questions and all were answered. The patient agreed with the plan and demonstrated an understanding of the instructions.   The patient was advised to call back or seek an in-person evaluation if the symptoms worsen or if the condition fails to improve as anticipated.  I provided 30 minutes of non-face-to-face time during this encounter.   Molli Barrows, MD

## 2019-03-28 ENCOUNTER — Other Ambulatory Visit: Payer: Self-pay | Admitting: Neurology

## 2019-03-28 DIAGNOSIS — IMO0002 Reserved for concepts with insufficient information to code with codable children: Secondary | ICD-10-CM

## 2019-03-28 DIAGNOSIS — G43709 Chronic migraine without aura, not intractable, without status migrainosus: Secondary | ICD-10-CM

## 2019-05-15 ENCOUNTER — Ambulatory Visit
Admission: RE | Admit: 2019-05-15 | Discharge: 2019-05-15 | Disposition: A | Discharge status: Home / Self Care | Payer: BC Managed Care – PPO | Source: Ambulatory Visit | Attending: Neurology | Admitting: Neurology

## 2019-05-15 ENCOUNTER — Other Ambulatory Visit: Payer: Self-pay

## 2019-05-15 DIAGNOSIS — IMO0002 Reserved for concepts with insufficient information to code with codable children: Secondary | ICD-10-CM

## 2019-05-15 DIAGNOSIS — G43709 Chronic migraine without aura, not intractable, without status migrainosus: Secondary | ICD-10-CM

## 2019-05-31 ENCOUNTER — Other Ambulatory Visit: Payer: Self-pay

## 2019-05-31 ENCOUNTER — Ambulatory Visit: Payer: BC Managed Care – PPO | Attending: Anesthesiology | Admitting: Anesthesiology

## 2019-05-31 ENCOUNTER — Encounter: Payer: Self-pay | Admitting: Anesthesiology

## 2019-05-31 DIAGNOSIS — M25511 Pain in right shoulder: Secondary | ICD-10-CM

## 2019-05-31 DIAGNOSIS — M25551 Pain in right hip: Secondary | ICD-10-CM | POA: Diagnosis not present

## 2019-05-31 DIAGNOSIS — M8700 Idiopathic aseptic necrosis of unspecified bone: Secondary | ICD-10-CM

## 2019-05-31 DIAGNOSIS — G894 Chronic pain syndrome: Secondary | ICD-10-CM | POA: Diagnosis not present

## 2019-05-31 DIAGNOSIS — F1190 Opioid use, unspecified, uncomplicated: Secondary | ICD-10-CM | POA: Diagnosis not present

## 2019-05-31 DIAGNOSIS — M25512 Pain in left shoulder: Secondary | ICD-10-CM

## 2019-05-31 DIAGNOSIS — M25552 Pain in left hip: Secondary | ICD-10-CM

## 2019-05-31 DIAGNOSIS — G44221 Chronic tension-type headache, intractable: Secondary | ICD-10-CM

## 2019-05-31 DIAGNOSIS — M19011 Primary osteoarthritis, right shoulder: Secondary | ICD-10-CM

## 2019-05-31 DIAGNOSIS — M19012 Primary osteoarthritis, left shoulder: Secondary | ICD-10-CM

## 2019-05-31 MED ORDER — HYDROMORPHONE HCL 4 MG PO TABS: 4.0000 mg | ORAL_TABLET | ORAL | 0 refills | Status: AC | PRN

## 2019-05-31 NOTE — Progress Notes (Signed)
Virtual Visit via Video Note  I connected with Carl Solis on 05/31/19 at  9:45 AM EDT by a video enabled telemedicine application and verified that I am speaking with the correct person using two identifiers.  Location: Patient: Home Provider: Pain control center   I discussed the limitations of evaluation and management by telemedicine and the availability of in person appointments. The patient expressed understanding and agreed to proceed.  History of Present Illness: I spoke with Carl Solis via video for his virtual conference.  He reports that he is doing well with his diffuse body pain.  His shoulder pain has been improving somewhat following his surgery and the bilateral hip pain has been stable in nature.  No other changes are reported.  The pain quality characteristic distribution has been stable and he continues to take his Dilaudid without difficulty.  This continues to give him good relief with his pain and is creating no side effects.  Otherwise he is in his usual state of health at this time.    Observations/Objective:   Current Outpatient Medications:  .  albuterol (VENTOLIN HFA) 108 (90 Base) MCG/ACT inhaler, Inhale 2 puffs into the lungs every 6 (six) hours as needed for wheezing or shortness of breath., Disp: , Rfl:  .  ARIPiprazole (ABILIFY) 5 MG tablet, Take 5 mg by mouth daily., Disp: , Rfl:  .  candesartan (ATACAND) 8 MG tablet, Take 8 mg by mouth daily. , Disp: , Rfl:  .  cetirizine (ZYRTEC) 10 MG tablet, Take 10 mg by mouth daily., Disp: , Rfl:  .  citalopram (CELEXA) 40 MG tablet, Take 40 mg by mouth daily. , Disp: , Rfl:  .  diazepam (VALIUM) 5 MG tablet, Take 5 mg by mouth every 8 (eight) hours as needed for anxiety. , Disp: , Rfl:  .  diclofenac sodium (VOLTAREN) 1 % GEL, Apply 2 g topically 4 (four) times daily as needed (shoulder pain)., Disp: , Rfl:  .  EPINEPHrine 0.3 mg/0.3 mL IJ SOAJ injection, Inject 0.3 mLs into the skin as needed for anaphylaxis. ,  Disp: , Rfl:  .  fluticasone (FLONASE) 50 MCG/ACT nasal spray, Place 2 sprays into both nostrils daily. , Disp: , Rfl:  .  [START ON 06/03/2019] HYDROmorphone (DILAUDID) 4 MG tablet, Take 1 tablet (4 mg total) by mouth every 4 (four) hours as needed for severe pain., Disp: 150 tablet, Rfl: 0 .  [START ON 07/03/2019] HYDROmorphone (DILAUDID) 4 MG tablet, Take 1 tablet (4 mg total) by mouth every 4 (four) hours as needed for severe pain., Disp: 150 tablet, Rfl: 0 .  naloxone (NARCAN) nasal spray 4 mg/0.1 mL, As directed for opioid induced respiratory depression, Disp: 1 each, Rfl: 1 .  ondansetron (ZOFRAN ODT) 4 MG disintegrating tablet, Take 1 tablet (4 mg total) by mouth every 8 (eight) hours as needed for nausea or vomiting., Disp: 20 tablet, Rfl: 0 .  pantoprazole (PROTONIX) 40 MG tablet, Take 40 mg by mouth 2 (two) times daily. , Disp: , Rfl:  .  QUEtiapine (SEROQUEL) 100 MG tablet, Take 100 mg by mouth at bedtime as needed (persistent migraine). , Disp: , Rfl:  .  QUEtiapine (SEROQUEL) 25 MG tablet, Take 75 mg by mouth daily as needed (onset of migraine). , Disp: , Rfl:  .  Ubrogepant (UBRELVY) 50 MG TABS, Take 100 mg by mouth daily as needed (migraine)., Disp: , Rfl:  Assessment and Plan: 1. Hip pain, bilateral   2. Chronic, continuous use of  opioids   3. Chronic pain syndrome   4. Avascular necrosis of bone ([place])   5. Osteoarthritis of both shoulders, unspecified osteoarthritis type   6. Chronic tension-type headache, intractable   7. Avascular necrosis ([place])   8. Pain in joint of right shoulder   9. Pain of both shoulder joints   I have attempted to review the practitioner database information but that system is not up at this time.  I have spoken with the patient and he has been compliant with the regimen with no evidence of any illicit or diverting use.  He is continued to derive good functional benefit from his opioid management.  I will prescribe a refill for him dated May 1 May 31.  He  is to continue follow-up with his primary care physicians for his baseline medical care and we will schedule him for return to clinic in 2 months he is instructed to contact us if he should have any problems with his pain management.  Follow Up Instructions:    I discussed the assessment and treatment plan with the patient. The patient was provided an opportunity to ask questions and all were answered. The patient agreed with the plan and demonstrated an understanding of the instructions.   The patient was advised to call back or seek an in-person evaluation if the symptoms worsen or if the condition fails to improve as anticipated.  I provided 25 minutes of non-face-to-face time during this encounter.   Molli Barrows, MD

## 2019-10-30 ENCOUNTER — Ambulatory Visit: Payer: Self-pay | Admitting: Surgery

## 2019-11-03 ENCOUNTER — Encounter
Admission: RE | Admit: 2019-11-03 | Discharge: 2019-11-03 | Disposition: A | Discharge status: Home / Self Care | Payer: BC Managed Care – PPO | Source: Ambulatory Visit | Attending: Surgery | Admitting: Surgery

## 2019-11-03 ENCOUNTER — Other Ambulatory Visit: Payer: Self-pay

## 2019-11-03 HISTORY — DX: Depression, unspecified: F32.A

## 2019-11-03 HISTORY — DX: Prediabetes: R73.03

## 2019-11-03 HISTORY — DX: Anxiety disorder, unspecified: F41.9

## 2019-11-03 NOTE — Patient Instructions (Signed)
Your procedure is scheduled on: Thursday November 09, 2019. Report to Day Surgery inside [facility] 2nd floor. To find out your arrival time please call [phone] between 1PM - 3PM on Wednesday November 08, 2019.  Remember: Instructions that are not followed completely may result in serious medical risk,  up to and including death, or upon the discretion of your surgeon and anesthesiologist your  surgery may need to be rescheduled.     _X__ 1. Do not eat food after midnight the night before your procedure.                 No chewing gum or hard candies. You may drink clear liquids up to 2 hours                 before you are scheduled to arrive for your surgery- DO not drink clear                 liquids within 2 hours of the start of your surgery.                 Clear Liquids include:  water, apple juice without pulp, clear Gatorade, G2 or                  Gatorade Zero (avoid Red/Purple/Blue), Black Coffee or Tea (Do not add                 anything to coffee or tea).  __X__2.  On the morning of surgery brush your teeth with toothpaste and water, you                may rinse your mouth with mouthwash if you wish.  Do not swallow any toothpaste of mouthwash.     _X__ 3.  No Alcohol for 24 hours before or after surgery.   _X__ 4.  Do Not Smoke or use e-cigarettes For 24 Hours Prior to Your Surgery.                 Do not use any chewable tobacco products for at least 6 hours prior to                 Surgery.  _X__  5.  Do not use any recreational drugs (marijuana, cocaine, heroin, ecstasy, MDMA or other)                For at least one week prior to your surgery.  Combination of these drugs with anesthesia                May have life threatening results.  __x__ 6.  Notify your doctor if there is any change in your medical condition      (cold, fever, infections).     Do not wear jewelry, make-up, hairpins, clips or nail polish. Do not wear lotions,  powders, or perfumes. You may wear deodorant. Do not shave 48 hours prior to surgery. Men may shave face and neck. Do not bring valuables to the hospital.    [facility] is not responsible for any belongings or valuables.  Contacts, dentures or bridgework may not be worn into surgery. Leave your suitcase in the car. After surgery it may be brought to your room. For patients admitted to the hospital, discharge time is determined by your treatment team.   Patients discharged the day of surgery will not be allowed to drive home.   Make arrangements for someone to be  with you for the first 24 hours of your Same Day Discharge.   __x__ Take these medicines the morning of surgery with A SIP OF WATER:    1. citalopram (CELEXA) 40 MG   2. ARIPiprazole (ABILIFY) 5 MG    __x__ Use CHG Soap as directed  __x__ Use inhalers on the day of surgery.  fluticasone (FLONASE) 50 MCG/ACT  __x__ Stop Anti-inflammatories such as Ibuprofen, Aleve, Advil, naproxen, aspirin and or BC powders.    __x__ Stop supplements until after surgery.    __x__ Do not start any herbal supplements before your procedure.    If you have any questions regarding your pre-procedure instructions,  Please call Pre-admit Testing at [phone].

## 2019-11-07 ENCOUNTER — Other Ambulatory Visit
Admission: RE | Admit: 2019-11-07 | Discharge: 2019-11-07 | Disposition: A | Discharge status: Home / Self Care | Payer: BC Managed Care – PPO | Source: Ambulatory Visit | Attending: Surgery | Admitting: Surgery

## 2019-11-07 ENCOUNTER — Other Ambulatory Visit: Payer: Self-pay

## 2019-11-07 DIAGNOSIS — Z01812 Encounter for preprocedural laboratory examination: Secondary | ICD-10-CM | POA: Insufficient documentation

## 2019-11-07 DIAGNOSIS — Z20822 Contact with and (suspected) exposure to covid-19: Secondary | ICD-10-CM | POA: Diagnosis not present

## 2019-11-07 LAB — SARS CORONAVIRUS 2 (TAT 6-24 HRS): SARS Coronavirus 2: NEGATIVE

## 2019-11-09 ENCOUNTER — Encounter
Admission: RE | Disposition: A | Discharge status: Home / Self Care | Payer: Self-pay | Source: Home / Self Care | Attending: Surgery

## 2019-11-09 ENCOUNTER — Ambulatory Visit: Payer: BC Managed Care – PPO | Admitting: Certified Registered"

## 2019-11-09 ENCOUNTER — Other Ambulatory Visit: Payer: Self-pay

## 2019-11-09 ENCOUNTER — Encounter: Payer: Self-pay | Admitting: Surgery

## 2019-11-09 ENCOUNTER — Ambulatory Visit
Admission: RE | Admit: 2019-11-09 | Discharge: 2019-11-09 | Disposition: A | Discharge status: Home / Self Care | Payer: BC Managed Care – PPO | Attending: Surgery | Admitting: Surgery

## 2019-11-09 DIAGNOSIS — D224 Melanocytic nevi of scalp and neck: Secondary | ICD-10-CM | POA: Diagnosis not present

## 2019-11-09 DIAGNOSIS — Z881 Allergy status to other antibiotic agents status: Secondary | ICD-10-CM | POA: Insufficient documentation

## 2019-11-09 DIAGNOSIS — Z91040 Latex allergy status: Secondary | ICD-10-CM | POA: Diagnosis not present

## 2019-11-09 DIAGNOSIS — F32A Depression, unspecified: Secondary | ICD-10-CM | POA: Diagnosis not present

## 2019-11-09 DIAGNOSIS — K432 Incisional hernia without obstruction or gangrene: Secondary | ICD-10-CM | POA: Diagnosis present

## 2019-11-09 DIAGNOSIS — Z882 Allergy status to sulfonamides status: Secondary | ICD-10-CM | POA: Insufficient documentation

## 2019-11-09 DIAGNOSIS — Z79899 Other long term (current) drug therapy: Secondary | ICD-10-CM | POA: Diagnosis not present

## 2019-11-09 DIAGNOSIS — F17210 Nicotine dependence, cigarettes, uncomplicated: Secondary | ICD-10-CM | POA: Insufficient documentation

## 2019-11-09 DIAGNOSIS — F419 Anxiety disorder, unspecified: Secondary | ICD-10-CM | POA: Diagnosis not present

## 2019-11-09 DIAGNOSIS — M1990 Unspecified osteoarthritis, unspecified site: Secondary | ICD-10-CM | POA: Diagnosis not present

## 2019-11-09 DIAGNOSIS — Z9109 Other allergy status, other than to drugs and biological substances: Secondary | ICD-10-CM | POA: Diagnosis not present

## 2019-11-09 DIAGNOSIS — Z885 Allergy status to narcotic agent status: Secondary | ICD-10-CM | POA: Diagnosis not present

## 2019-11-09 DIAGNOSIS — Z96643 Presence of artificial hip joint, bilateral: Secondary | ICD-10-CM | POA: Insufficient documentation

## 2019-11-09 DIAGNOSIS — K219 Gastro-esophageal reflux disease without esophagitis: Secondary | ICD-10-CM | POA: Insufficient documentation

## 2019-11-09 DIAGNOSIS — Z880 Allergy status to penicillin: Secondary | ICD-10-CM | POA: Diagnosis not present

## 2019-11-09 HISTORY — PX: MASS EXCISION: SHX2000

## 2019-11-09 SURGERY — REPAIR, HERNIA, UMBILICAL, ROBOT-ASSISTED
Anesthesia: General | Site: Head

## 2019-11-09 MED ORDER — CHLORHEXIDINE GLUCONATE 0.12 % MT SOLN
OROMUCOSAL | Status: AC
  Administered 2019-11-09: 15 mL via OROMUCOSAL
  Filled 2019-11-09: qty 15

## 2019-11-09 MED ORDER — CHLORHEXIDINE GLUCONATE 0.12 % MT SOLN: 15.0000 mL | Freq: Once | OROMUCOSAL | Status: AC

## 2019-11-09 MED ORDER — FENTANYL CITRATE (PF) 250 MCG/5ML IJ SOLN
INTRAMUSCULAR | Status: AC
  Filled 2019-11-09: qty 5

## 2019-11-09 MED ORDER — ROCURONIUM BROMIDE 100 MG/10ML IV SOLN
INTRAVENOUS | Status: DC | PRN
  Administered 2019-11-09: 50 mg via INTRAVENOUS
  Administered 2019-11-09: 20 mg via INTRAVENOUS

## 2019-11-09 MED ORDER — GABAPENTIN 300 MG PO CAPS
ORAL_CAPSULE | ORAL | Status: AC
  Filled 2019-11-09: qty 1

## 2019-11-09 MED ORDER — LIDOCAINE HCL 1 % IJ SOLN
INTRAMUSCULAR | Status: DC | PRN
  Administered 2019-11-09: 10 mL

## 2019-11-09 MED ORDER — GABAPENTIN 300 MG PO CAPS: 300.0000 mg | ORAL_CAPSULE | ORAL | Status: DC

## 2019-11-09 MED ORDER — DEXAMETHASONE SODIUM PHOSPHATE 10 MG/ML IJ SOLN
INTRAMUSCULAR | Status: DC | PRN
  Administered 2019-11-09: 10 mg via INTRAVENOUS

## 2019-11-09 MED ORDER — ORAL CARE MOUTH RINSE: 15.0000 mL | Freq: Once | OROMUCOSAL | Status: AC

## 2019-11-09 MED ORDER — SODIUM CHLORIDE 0.9 % IV SOLN: INTRAVENOUS | Status: DC

## 2019-11-09 MED ORDER — ACETAMINOPHEN 160 MG/5ML PO SOLN
325.0000 mg | ORAL | Status: DC | PRN
  Filled 2019-11-09: qty 20.3

## 2019-11-09 MED ORDER — DROPERIDOL 2.5 MG/ML IJ SOLN
0.6250 mg | Freq: Once | INTRAMUSCULAR | Status: DC | PRN
  Filled 2019-11-09: qty 2

## 2019-11-09 MED ORDER — SODIUM CHLORIDE (PF) 0.9 % IJ SOLN
INTRAMUSCULAR | Status: AC
  Filled 2019-11-09: qty 50

## 2019-11-09 MED ORDER — MEPERIDINE HCL 50 MG/ML IJ SOLN: 6.2500 mg | INTRAMUSCULAR | Status: DC | PRN

## 2019-11-09 MED ORDER — FAMOTIDINE 20 MG PO TABS: 20.0000 mg | ORAL_TABLET | Freq: Once | ORAL | Status: DC

## 2019-11-09 MED ORDER — HYDROCODONE-ACETAMINOPHEN 7.5-325 MG PO TABS
ORAL_TABLET | ORAL | Status: AC
  Filled 2019-11-09: qty 1

## 2019-11-09 MED ORDER — PROMETHAZINE HCL 25 MG/ML IJ SOLN: 6.2500 mg | INTRAMUSCULAR | Status: DC | PRN

## 2019-11-09 MED ORDER — PROPOFOL 10 MG/ML IV BOLUS
INTRAVENOUS | Status: AC
  Filled 2019-11-09: qty 20

## 2019-11-09 MED ORDER — CEFAZOLIN SODIUM-DEXTROSE 2-4 GM/100ML-% IV SOLN
INTRAVENOUS | Status: AC
  Filled 2019-11-09: qty 100

## 2019-11-09 MED ORDER — LIDOCAINE HCL (CARDIAC) PF 100 MG/5ML IV SOSY
PREFILLED_SYRINGE | INTRAVENOUS | Status: DC | PRN
  Administered 2019-11-09: 40 mg via INTRAVENOUS

## 2019-11-09 MED ORDER — HYDROCODONE-ACETAMINOPHEN 7.5-325 MG PO TABS
1.0000 | ORAL_TABLET | Freq: Once | ORAL | Status: AC | PRN
  Administered 2019-11-09: 1 via ORAL
  Filled 2019-11-09: qty 1

## 2019-11-09 MED ORDER — CEFAZOLIN SODIUM-DEXTROSE 2-4 GM/100ML-% IV SOLN
2.0000 g | INTRAVENOUS | Status: AC
  Administered 2019-11-09: 2 g via INTRAVENOUS

## 2019-11-09 MED ORDER — MIDAZOLAM HCL 2 MG/2ML IJ SOLN
INTRAMUSCULAR | Status: DC | PRN
  Administered 2019-11-09: 2 mg via INTRAVENOUS

## 2019-11-09 MED ORDER — HYDROCODONE-ACETAMINOPHEN 5-325 MG PO TABS
1.0000 | ORAL_TABLET | Freq: Four times a day (QID) | ORAL | 0 refills | Status: DC | PRN
Start: 2019-11-09 — End: 2023-03-10

## 2019-11-09 MED ORDER — GLYCOPYRROLATE 0.2 MG/ML IJ SOLN
INTRAMUSCULAR | Status: DC | PRN
  Administered 2019-11-09: .2 mg via INTRAVENOUS

## 2019-11-09 MED ORDER — ACETAMINOPHEN 325 MG PO TABS: 325.0000 mg | ORAL_TABLET | ORAL | Status: DC | PRN

## 2019-11-09 MED ORDER — SUGAMMADEX SODIUM 200 MG/2ML IV SOLN
INTRAVENOUS | Status: DC | PRN
  Administered 2019-11-09: 200 mg via INTRAVENOUS

## 2019-11-09 MED ORDER — BACITRACIN ZINC 500 UNIT/GM EX OINT
TOPICAL_OINTMENT | CUTANEOUS | Status: AC
  Filled 2019-11-09: qty 28.35

## 2019-11-09 MED ORDER — BUPIVACAINE-EPINEPHRINE (PF) 0.5% -1:200000 IJ SOLN
INTRAMUSCULAR | Status: AC
  Filled 2019-11-09: qty 30

## 2019-11-09 MED ORDER — DEXMEDETOMIDINE HCL 200 MCG/2ML IV SOLN
INTRAVENOUS | Status: DC | PRN
  Administered 2019-11-09: 12 ug via INTRAVENOUS

## 2019-11-09 MED ORDER — DOCUSATE SODIUM 100 MG PO CAPS: 100.0000 mg | ORAL_CAPSULE | Freq: Two times a day (BID) | ORAL | 0 refills | Status: AC | PRN

## 2019-11-09 MED ORDER — FENTANYL CITRATE (PF) 100 MCG/2ML IJ SOLN
INTRAMUSCULAR | Status: DC | PRN
  Administered 2019-11-09 (×2): 50 ug via INTRAVENOUS
  Administered 2019-11-09: 100 ug via INTRAVENOUS

## 2019-11-09 MED ORDER — SODIUM CHLORIDE 0.9 % IV SOLN
INTRAVENOUS | Status: DC | PRN
  Administered 2019-11-09: 40 mL

## 2019-11-09 MED ORDER — ACETAMINOPHEN 500 MG PO TABS
ORAL_TABLET | ORAL | Status: AC
  Administered 2019-11-09: 1000 mg via ORAL
  Filled 2019-11-09: qty 2

## 2019-11-09 MED ORDER — MIDAZOLAM HCL 2 MG/2ML IJ SOLN
INTRAMUSCULAR | Status: AC
  Filled 2019-11-09: qty 2

## 2019-11-09 MED ORDER — ACETAMINOPHEN 500 MG PO TABS: 1000.0000 mg | ORAL_TABLET | ORAL | Status: AC

## 2019-11-09 MED ORDER — ESMOLOL HCL 100 MG/10ML IV SOLN
INTRAVENOUS | Status: DC | PRN
  Administered 2019-11-09: 20 mg via INTRAVENOUS

## 2019-11-09 MED ORDER — ACETAMINOPHEN 325 MG PO TABS: 650.0000 mg | ORAL_TABLET | Freq: Three times a day (TID) | ORAL | 0 refills | Status: AC | PRN

## 2019-11-09 MED ORDER — FENTANYL CITRATE (PF) 100 MCG/2ML IJ SOLN
25.0000 ug | INTRAMUSCULAR | Status: DC | PRN
  Administered 2019-11-09 (×3): 25 ug via INTRAVENOUS

## 2019-11-09 MED ORDER — ONDANSETRON HCL 4 MG/2ML IJ SOLN
INTRAMUSCULAR | Status: DC | PRN
  Administered 2019-11-09: 4 mg via INTRAVENOUS

## 2019-11-09 MED ORDER — PHENYLEPHRINE HCL (PRESSORS) 10 MG/ML IV SOLN
INTRAVENOUS | Status: DC | PRN
  Administered 2019-11-09: 100 ug via INTRAVENOUS

## 2019-11-09 MED ORDER — PROPOFOL 10 MG/ML IV BOLUS
INTRAVENOUS | Status: DC | PRN
  Administered 2019-11-09: 200 mg via INTRAVENOUS

## 2019-11-09 MED ORDER — BUPIVACAINE LIPOSOME 1.3 % IJ SUSP
INTRAMUSCULAR | Status: AC
  Filled 2019-11-09: qty 20

## 2019-11-09 MED ORDER — FENTANYL CITRATE (PF) 100 MCG/2ML IJ SOLN
INTRAMUSCULAR | Status: AC
  Filled 2019-11-09: qty 2

## 2019-11-09 MED ORDER — CHLORHEXIDINE GLUCONATE CLOTH 2 % EX PADS
6.0000 | MEDICATED_PAD | Freq: Once | CUTANEOUS | Status: AC
  Administered 2019-11-09: 6 via TOPICAL

## 2019-11-09 SURGICAL SUPPLY — 70 items
ADH SKN CLS APL DERMABOND .7 (GAUZE/BANDAGES/DRESSINGS) ×2
APL PRP STRL LF DISP 70% ISPRP (MISCELLANEOUS) ×2
BLADE SURG 15 STRL LF DISP TIS (BLADE) ×2 IMPLANT
BLADE SURG 15 STRL SS (BLADE) ×4
BLADE SURG SZ11 CARB STEEL (BLADE) ×4 IMPLANT
CANISTER SUCT 1200ML W/VALVE (MISCELLANEOUS) ×4 IMPLANT
CANNULA REDUC XI 12-8 STAPL (CANNULA) ×1
CANNULA REDUC XI 12-8MM STAPL (CANNULA) ×1
CANNULA REDUCER 12-8 DVNC XI (CANNULA) ×2 IMPLANT
CHLORAPREP W/TINT 26 (MISCELLANEOUS) ×4 IMPLANT
COVER TIP SHEARS 8 DVNC (MISCELLANEOUS) ×2 IMPLANT
COVER TIP SHEARS 8MM DA VINCI (MISCELLANEOUS) ×2
COVER WAND RF STERILE (DRAPES) ×4 IMPLANT
DEFOGGER SCOPE WARMER CLEARIFY (MISCELLANEOUS) ×4 IMPLANT
DERMABOND ADVANCED (GAUZE/BANDAGES/DRESSINGS) ×2
DERMABOND ADVANCED .7 DNX12 (GAUZE/BANDAGES/DRESSINGS) ×2 IMPLANT
DRAPE 3/4 80X56 (DRAPES) ×4 IMPLANT
DRAPE ARM DVNC X/XI (DISPOSABLE) ×8 IMPLANT
DRAPE COLUMN DVNC XI (DISPOSABLE) ×2 IMPLANT
DRAPE DA VINCI XI ARM (DISPOSABLE) ×8
DRAPE DA VINCI XI COLUMN (DISPOSABLE) ×2
DRAPE LAPAROTOMY 100X77 ABD (DRAPES) ×4 IMPLANT
ELECT CAUTERY BLADE 6.4 (BLADE) ×4 IMPLANT
ELECT REM PT RETURN 9FT ADLT (ELECTROSURGICAL) ×4
ELECTRODE REM PT RTRN 9FT ADLT (ELECTROSURGICAL) ×2 IMPLANT
GLOVE BIOGEL PI IND STRL 7.0 (GLOVE) ×4 IMPLANT
GLOVE BIOGEL PI INDICATOR 7.0 (GLOVE) ×4
GLOVE SURG SYN 6.5 ES PF (GLOVE) ×8 IMPLANT
GOWN STRL REUS W/ TWL LRG LVL3 (GOWN DISPOSABLE) ×6 IMPLANT
GOWN STRL REUS W/TWL LRG LVL3 (GOWN DISPOSABLE) ×12
GRASPER SUT TROCAR 14GX15 (MISCELLANEOUS) IMPLANT
IRRIGATOR SUCT 8 DISP DVNC XI (IRRIGATION / IRRIGATOR) IMPLANT
IRRIGATOR SUCTION 8MM XI DISP (IRRIGATION / IRRIGATOR)
IV NS 1000ML (IV SOLUTION)
IV NS 1000ML BAXH (IV SOLUTION) IMPLANT
KIT PINK PAD W/HEAD ARE REST (MISCELLANEOUS) ×4
KIT PINK PAD W/HEAD ARM REST (MISCELLANEOUS) ×2 IMPLANT
KIT TURNOVER KIT A (KITS) ×4 IMPLANT
LABEL OR SOLS (LABEL) ×4 IMPLANT
MESH PROGRIP HERNIA FLAT 15X15 (Mesh General) ×4 IMPLANT
NEEDLE HYPO 22GX1.5 SAFETY (NEEDLE) ×4 IMPLANT
NEEDLE INSUFFLATION 14GA 120MM (NEEDLE) ×4 IMPLANT
NS IRRIG 1000ML POUR BTL (IV SOLUTION) ×4 IMPLANT
OBTURATOR OPTICAL STANDARD 8MM (TROCAR) ×2
OBTURATOR OPTICAL STND 8 DVNC (TROCAR) ×2
OBTURATOR OPTICALSTD 8 DVNC (TROCAR) ×2 IMPLANT
PACK BASIN MINOR (MISCELLANEOUS) ×4 IMPLANT
PACK LAP CHOLECYSTECTOMY (MISCELLANEOUS) ×4 IMPLANT
PENCIL ELECTRO HAND CTR (MISCELLANEOUS) ×4 IMPLANT
SEAL CANN UNIV 5-8 DVNC XI (MISCELLANEOUS) ×4 IMPLANT
SEAL XI 5MM-8MM UNIVERSAL (MISCELLANEOUS) ×4
SET TUBE SMOKE EVAC HIGH FLOW (TUBING) ×4 IMPLANT
SOLUTION ELECTROLUBE (MISCELLANEOUS) ×4 IMPLANT
STAPLER CANNULA SEAL DVNC XI (STAPLE) ×2 IMPLANT
STAPLER CANNULA SEAL XI (STAPLE) ×2
SUT ETHILON 3-0 FS-10 30 BLK (SUTURE)
SUT MNCRL 4-0 (SUTURE) ×4
SUT MNCRL 4-0 27XMFL (SUTURE) ×2
SUT MNCRL AB 4-0 PS2 18 (SUTURE) ×4 IMPLANT
SUT STRATAFIX PDS 30 CT-1 (SUTURE) ×4 IMPLANT
SUT V-LOC 90 ABS DVC 3-0 CL (SUTURE) ×8 IMPLANT
SUT VIC AB 3-0 SH 27 (SUTURE) ×4
SUT VIC AB 3-0 SH 27X BRD (SUTURE) ×2 IMPLANT
SUT VICRYL 0 AB UR-6 (SUTURE) ×4 IMPLANT
SUTURE EHLN 3-0 FS-10 30 BLK (SUTURE) IMPLANT
SUTURE MNCRL 4-0 27XMF (SUTURE) ×2 IMPLANT
SYR 30ML LL (SYRINGE) ×4 IMPLANT
TOWEL OR 17X26 4PK STRL BLUE (TOWEL DISPOSABLE) ×4 IMPLANT
TRAY FOLEY MTR SLVR 16FR STAT (SET/KITS/TRAYS/PACK) IMPLANT
TROCAR XCEL NON-BLD 5MMX100MML (ENDOMECHANICALS) IMPLANT

## 2019-11-09 NOTE — Discharge Instructions (Signed)
AMBULATORY SURGERY  DISCHARGE INSTRUCTIONS   1) The drugs that you were given will stay in your system until tomorrow so for the next 24 hours you should not:  A) Drive an automobile B) Make any legal decisions C) Drink any alcoholic beverage   2) You may resume regular meals tomorrow.  Today it is better to start with liquids and gradually work up to solid foods.  You may eat anything you prefer, but it is better to start with liquids, then soup and crackers, and gradually work up to solid foods.   3) Please notify your doctor immediately if you have any unusual bleeding, trouble breathing, redness and pain at the surgery site, drainage, fever, or pain not relieved by medication.    4) Additional Instructions:        Please contact your physician with any problems or Same Day Surgery at [phone], Monday through Friday 6 am to 4 pm, or [place] at [facility] number at [phone].Hernia repair, Care After This sheet gives you information about how to care for yourself after your procedure. Your health care provider may also give you more specific instructions. If you have problems or questions, contact your health care provider. What can I expect after the procedure? After your procedure, it is common to have the following:  Pain in your abdomen, especially in the incision areas. You will be given medicine to control the pain.  Tiredness. This is a normal part of the recovery process. Your energy level will return to normal over the next several weeks.  Changes in your bowel movements, such as constipation or needing to go more often. Talk with your health care provider about how to manage this. Follow these instructions at home: Medicines   tylenol and advil as needed for discomfort.  Please alternate between the two every four hours as needed for pain.     Use narcotics, if prescribed, only when tylenol and motrin is not enough to control pain.   325-650mg   every 8hrs to max of 3000mg /24hrs (including the 325mg  in every norco dose) for the tylenol.     Advil up to 800mg  per dose every 8hrs as needed for pain.    PLEASE RECORD NUMBER OF PILLS TAKEN UNTIL NEXT FOLLOW UP APPT.  THIS WILL HELP DETERMINE HOW READY YOU ARE TO BE RELEASED FROM ANY ACTIVITY RESTRICTIONS  Do not drive or use heavy machinery while taking prescription pain medicine.  Do not drink alcohol while taking prescription pain medicine.  Incision care     Follow instructions from your health care provider about how to take care of your incision areas. Make sure you: ? Keep your incisions clean and dry. ? Wash your hands with soap and water before and after applying medicine to the areas, and before and after changing your bandage (dressing). If soap and water are not available, use hand sanitizer. ? OK TO REMOVE BANDAGE ON HEAD AFTER 24HRS, THEN KEEP AREA CLEAN AND DRY, COVERED WITH DAILY BANDAGE CHANGES UNTIL SEEN IN OFFICE ? Leave stitches (sutures), skin glue, or adhesive strips in place. These skin closures may need to stay in place for 2 weeks or longer. If adhesive strip edges start to loosen and curl up, you may trim the loose edges. Do not remove adhesive strips completely unless your health care provider tells you to do that.  Do not wear tight clothing over the incisions. Tight clothing may rub and irritate the incision areas, which may cause the  incisions to open.  Do not take baths, swim, or use a hot tub until your health care provider approves. OK TO SHOWER IN 24HRS.    Check your incision area every day for signs of infection. Check for: ? More redness, swelling, or pain. ? More fluid or blood. ? Warmth. ? Pus or a bad smell. Activity  Avoid lifting anything that is heavier than 10 lb (4.5 kg) for 2 weeks or until your health care provider says it is okay.  No pushing/pulling greater than 30lbs  You may resume normal activities as told by your health care  provider. Ask your health care provider what activities are safe for you.  Take rest breaks during the day as needed. Eating and drinking  Follow instructions from your health care provider about what you can eat after surgery.  To prevent or treat constipation while you are taking prescription pain medicine, your health care provider may recommend that you: ? Drink enough fluid to keep your urine clear or pale yellow. ? Take over-the-counter or prescription medicines. ? Eat foods that are high in fiber, such as fresh fruits and vegetables, whole grains, and beans. ? Limit foods that are high in fat and processed sugars, such as fried and sweet foods. General instructions  Ask your health care provider when you will need an appointment to get your sutures or staples removed.  Keep all follow-up visits as told by your health care provider. This is important. Contact a health care provider if:  You have more redness, swelling, or pain around your incisions.  You have more fluid or blood coming from the incisions.  Your incisions feel warm to the touch.  You have pus or a bad smell coming from your incisions or your dressing.  You have a fever.  You have an incision that breaks open (edges not staying together) after sutures or staples have been removed. Get help right away if:  You develop a rash.  You have chest pain or difficulty breathing.  You have pain or swelling in your legs.  You feel light-headed or you faint.  Your abdomen swells (becomes distended).  You have nausea or vomiting.  You have blood in your stool (feces). This information is not intended to replace advice given to you by your health care provider. Make sure you discuss any questions you have with your health care provider. Document Released: 08/08/2004 Document Revised: 10/08/2017 Document Reviewed: 10/21/2015 Elsevier Interactive Patient Education  2019 [employer].

## 2019-11-09 NOTE — Op Note (Signed)
Preoperative diagnosis: Incisional hernia, scalp cyst Postoperative diagnosis: same  Procedure: Robotic assisted laparoscopic incisional hernia repair with mesh, left temporal scalp cyst removal  Anesthesia: general  Surgeon: Benjamine Sprague  Wound Classification: Clean  Specimen: none  Complications: None  Estimated Blood Loss: 60ml  Indications:see HPI  Findings: 1. incisional hernia 4. Tension free repair achieved with progrip mesh and suture 5. Adequate hemostasis  Description of procedure: The patient was brought to the operating room and general anesthesia was induced. A time-out was completed verifying correct patient, procedure, site, positioning, and implant(s) and/or special equipment prior to beginning this procedure. Antibiotics were administered prior to making the incision. SCDs placed. The anterior abdominal wall was prepped and draped in the standard sterile fashion.   Palmer's point chosen for entry.  Veress needle placed and abdomen insufflated to 15cm without any dramatic increase in pressure.  Needle removed and optiview technique used to place 59mm port in LUQ.  No injury noted during placement. 2 additional ports, 37mm and 71mm, along left lateral aspect placed.  The 43mm port replaced the LUQ port, and original 20mm port placed inbetween 12 and 40mm.  Xi robot then docked into place.  2.5cm incisional hernia noted. preperiotoneal flap was made along left abdominal wall and carried to and across the midline hernia defect, clearing space for placement of progrip mesh with 5cm overlap surrounding it.  A additional hernia measuring 0.46mm was noted immediately cephalad to original hernia during the creation of the flap and reduction of fat contents.  Insufflation dropped to 21mm and transfacial suture with 0 stratafix used to primarily close defects under minimal tension. Progrip mesh measuring 13cm x13cm was placed within the abdominal cavity through 21mm port and secured to  the abdominal wall centered over the defect.  Hemostasis confirmed, and flap closed using 2-0 VLock x2.    Robot was undocked.  The 32mm cannula was removed and port site was closed using PMI device and 0 vicryl suture, ensuring no bowels were injured during this process.  Abdomen then desufflated while camera within abdomen to ensure no signs of new bleed prior to removing camera and rest of ports completely.   All skin incisions closed with runninrg 4-0 Monocryl in a subcuticular fashion.  All wounds then dressed with Dermabond.  Attention then turned to the left temporal scalp cyst.  Area sterilized and draped in the usual fashion.  Incision made over the cyst and it was noted to be potentially a mass, so a elliptical incision was made around it, and the 2 cm cyst/mass was removed down to the subcuticular layer.  Specimen was passed off operative field pending pathology.  Hemostasis achieved using electrocautery, and the wound was closed using 3-0 nylon in vertical mattress fashion under minimal tension.  Wound was then dressed with bacitracin ointment and Band-Aid.  Patient was then successfully awakened and transferred to PACU in stable condition.  At the end of the procedure sponge and instrument counts were correct.

## 2019-11-09 NOTE — Transfer of Care (Signed)
Immediate Anesthesia Transfer of Care Note  Patient: Carl Solis  Procedure(s) Performed: XI ROBOT ASSISTED UMBILICAL HERNIA REPAIR (N/A Abdomen) EXCISION cyst on head (N/A Head)  Patient Location: PACU  Anesthesia Type:General  Level of Consciousness: awake, alert  and oriented  Airway & Oxygen Therapy: Patient Spontanous Breathing  Post-op Assessment: Report given to RN and Post -op Vital signs reviewed and stable  Post vital signs: Reviewed and stable  Last Vitals:  Vitals Value Taken Time  BP    Temp    Pulse    Resp    SpO2      Last Pain:  Vitals:   11/09/19 1058  TempSrc: Oral  PainSc: 5          Complications: No complications documented.

## 2019-11-09 NOTE — Interval H&P Note (Signed)
History and Physical Interval Note:  11/09/2019 11:08 AM  Carl Solis  has presented today for surgery, with the diagnosis of X01.7 Umbilical hernia w/o obstruction or gangrene / L72.9 cyst on head.  The various methods of treatment have been discussed with the patient and family. After consideration of risks, benefits and other options for treatment, the patient has consented to  Procedure(s): XI [place] (N/A) EXCISION cyst on head (N/A) as a surgical intervention.  The patient's history has been reviewed, patient examined, no change in status, stable for surgery.  I have reviewed the patient's chart and labs.  Questions were answered to the patient's satisfaction.     Tashari Schoenfelder Lysle Pearl

## 2019-11-09 NOTE — Anesthesia Procedure Notes (Signed)
Procedure Name: Intubation Performed by: Shalini Mair L, CRNA Pre-anesthesia Checklist: Patient identified, Patient being monitored, Timeout performed, Emergency Drugs available and Suction available Patient Re-evaluated:Patient Re-evaluated prior to induction Oxygen Delivery Method: Circle system utilized Preoxygenation: Pre-oxygenation with 100% oxygen Induction Type: IV induction Ventilation: Mask ventilation without difficulty Laryngoscope Size: 3 and McGraph Grade View: Grade I Tube type: Oral Tube size: 7.0 mm Number of attempts: 1 Airway Equipment and Method: Stylet Placement Confirmation: ETT inserted through vocal cords under direct vision,  positive ETCO2 and breath sounds checked- equal and bilateral Secured at: 21 cm Tube secured with: Tape Dental Injury: Teeth and Oropharynx as per pre-operative assessment        

## 2019-11-09 NOTE — H&P (Signed)
CC: Incisional hernia, without obstruction or gangrene [K43.2]  HPI: Carl Solis is a 34 y.o. male who was referred by Idelle Crouch, MD for evaluation of above. Symptoms were first noted a few months ago. Pain is sharp and intermittent, confined to the supraumbilical area, without radiation. Associated with nothing specfic, exacerbated by exertion. Lump is reducible.   Also, has growing lump on side of head, no discharge, asymptomatic.  Past Medical History: has a past medical history of Allergy, Anxiety, AVN of femur (CMS-HCC), Chickenpox, GERD (gastroesophageal reflux disease), Migraine headache, Osteoarthritis, and Pure hypercholesterolemia.  Past Surgical History:  Past Surgical History:  Procedure Laterality Date  . ADENOIDECTOMY  . CHOLECYSTECTOMY 07/19/2018  Dr Lesli Albee  . INCS DEEP W/OPEN BONE CORTEX PELVIS &/OR HIP Left 03/13/2014  at [facility]  . JOINT REPLACEMENT May 21, 2017  Bilateral hip replacement due to AVN  . tubes in ears   Family History: family history includes Alcohol abuse in his father and mother; Anxiety in his mother; Cancer in his father; Depression in his mother; Diabetes in his father; Diabetes type II in his father; Prostate cancer in his paternal grandfather.  Social History: reports that he has been smoking cigarettes. He has a 16.00 pack-year smoking history. He has never used smokeless tobacco. He reports current alcohol use. He reports that he does not use drugs.  Current Medications: has a current medication list which includes the following prescription(s): aripiprazole, blood glucose diagnostic, blood glucose meter, citalopram, diazepam, fluticasone propionate, lancing device with lancets, pramipexole, quetiapine, and rosuvastatin.  Allergies:  Allergies as of 10/30/2019 - Reviewed 10/30/2019  Allergen Reaction Noted  . Gabapentin Diarrhea 09/21/2018  . Amoxicillin Diarrhea 12/14/2013  . Dhe [dihydroergotamine] Other (See Comments)  09/05/2013  . Imitrex [sumatriptan] Other (See Comments) 09/05/2013  . Latex Rash 09/05/2013  . Maxalt [rizatriptan] Other (See Comments) 09/05/2013  . Pork/porcine containing products Headache 09/05/2013  . Relpax [eletriptan hbr] Other (See Comments) 09/05/2013  . Sulfacetamide sodium Diarrhea 03/27/2015  . Toradol [ketorolac] Hives and Rash 09/05/2013  . Venom-honey bee Other (See Comments) 05/02/2018  . Warfarin Unknown 05/14/2017  . Aspartame Unknown 05/30/2015  . Neomycin-bacitracin-polymyxin Hives and Rash 07/25/2014   ROS:  A 15 point review of systems was performed and pertinent positives and negatives noted in HPI  Objective:    BP 120/81  Pulse 88  Ht 177.8 cm (5\' 10" )  Wt 75.8 kg (167 lb)  BMI 23.96 kg/m   Constitutional : alert, appears stated age, cooperative and no distress  Lymphatics/Throat: no asymmetry, masses, or scars  Respiratory: clear to auscultation bilaterally  Cardiovascular: regular rate and rhythm  Gastrointestinal: soft, non-tender; bowel sounds normal; no masses, no organomegaly. incisional hernia noted. small, reducible, no overlying skin changes and mild TTP, deep to supraumbilical scar from previous lap chole  Musculoskeletal: Steady gait and movement  Skin: Cool and moist, cyst like structure on left temporal area behind and slightly above ear, with erythematous borderline. Non-TTP, somewhat mobile and soft, no overlying skin changes  Psychiatric: Normal affect, non-agitated, not confused    LABS:  n/a   RADS: n/a Assessment:    Incisional hernia, without obstruction or gangrene [K43.2]  Scalp cyst, left temporal area  Plan:    1. Incisional hernia, without obstruction or gangrene [K43.2]  Discussed the risk of surgery including recurrence, which can be up to 50% in the case of incisional or complex hernias, possible use of prosthetic materials (mesh) and the increased risk of mesh infxn  if used, bleeding, chronic pain, post-op  infxn, post-op SBO or ileus, and possible re-operation to address said risks. The risks of general anesthetic, if used, includes MI, CVA, sudden death or even reaction to anesthetic medications also discussed. Alternatives include continued observation. Benefits include possible symptom relief, prevention of incarceration, strangulation, enlargement in size over time, and the risk of emergency surgery in the face of strangulation.   Typical post-op recovery time of 3-5 days with 2 weeks of activity restrictions were also discussed.  ED return precautions given for sudden increase in pain, size of hernia with accompanying fever, nausea, and/or vomiting.  2. Patient has elected to proceed with surgical removal of scalp. Procedure will be scheduled. Written consent was obtained.. incisional hernia, robotic assisted laparoscopic.  Will proceed with cyst removal of scalp at same time. Discussed surgical excision. Alternatives include continued observation. Benefits include possible symptom relief, pathologic evaluation, improved cosmesis. Discussed the risk of surgery including recurrence, chronic pain, post-op infxn, poor cosmesis, poor/delayed wound healing, and possible re-operation to address said risks. The risks of general anesthetic, if used, includes MI, CVA, sudden death or even reaction to anesthetic medications also discussed.  Typical post-op recovery time of 3-5 days with possible activity restrictions were also discussed.  he patient verbalized understanding and all questions were answered to the patient's satisfaction.

## 2019-11-09 NOTE — Anesthesia Preprocedure Evaluation (Addendum)
Anesthesia Evaluation  Patient identified by MRN, date of birth, ID band Patient awake    Reviewed: Allergy & Precautions, H&P , NPO status , reviewed documented beta blocker date and time   Airway Mallampati: II  TM Distance: >3 FB Neck ROM: full    Dental  (+) Upper Dentures, Implants   Pulmonary Current Smoker and Patient abstained from smoking.,    Pulmonary exam normal        Cardiovascular + PND  Normal cardiovascular exam     Neuro/Psych  Headaches, PSYCHIATRIC DISORDERS Anxiety Depression    GI/Hepatic neg GERD  ,  Endo/Other    Renal/GU      Musculoskeletal   Abdominal   Peds  Hematology   Anesthesia Other Findings Past Medical History: No date: Anxiety No date: Avascular necrosis ([place]) 08/02/2018: Avascular necrosis ([place]) 08/02/2018: Chronic pain syndrome 08/02/2018: Chronic, continuous use of opioids No date: Depression 08/02/2018: Hip pain, bilateral No date: History of kidney stones No date: Migraine No date: Migraines No date: Pre-diabetes Past Surgical History: No date: ADENOIDECTOMY 07/19/2018: CHOLECYSTECTOMY; N/A     Comment:  Procedure: LAPAROSCOPIC CHOLECYSTECTOMY;  Surgeon:               Herbert Pun, MD;  Location: [facility] ORS;  Service:              General;  Laterality: N/A; No date: HIP SURGERY; Left 11/09/2018: TOTAL SHOULDER ARTHROPLASTY; Right     Comment:  Procedure: RIGHT SHOULDER HEMIARTHROPLASTY;  Surgeon:               Lovell Sheehan, MD;  Location: [facility] ORS;  Service:               Orthopedics;  Laterality: Right;   Reproductive/Obstetrics                            Anesthesia Physical Anesthesia Plan  ASA: III  Anesthesia Plan: General   Post-op Pain Management:    Induction: Intravenous  PONV Risk Score and Plan: Ondansetron and Treatment may vary due to age or medical condition  Airway Management Planned: Oral ETT  Additional  Equipment:   Intra-op Plan:   Post-operative Plan: Extubation in OR  Informed Consent: I have reviewed the patients History and Physical, chart, labs and discussed the procedure including the risks, benefits and alternatives for the proposed anesthesia with the patient or authorized representative who has indicated his/her understanding and acceptance.     Dental Advisory Given  Plan Discussed with: CRNA  Anesthesia Plan Comments:        Anesthesia Quick Evaluation

## 2019-11-10 ENCOUNTER — Encounter: Payer: Self-pay | Admitting: Surgery

## 2019-11-10 LAB — SURGICAL PATHOLOGY

## 2019-11-16 NOTE — Anesthesia Postprocedure Evaluation (Signed)
Anesthesia Post Note  Patient: Carl Solis  Procedure(s) Performed: XI ROBOT ASSISTED UMBILICAL HERNIA REPAIR (N/A Abdomen) EXCISION cyst on head (N/A Head)  Patient location during evaluation: PACU Anesthesia Type: General Level of consciousness: awake and alert Pain management: pain level controlled Vital Signs Assessment: post-procedure vital signs reviewed and stable Respiratory status: spontaneous breathing, nonlabored ventilation and respiratory function stable Cardiovascular status: blood pressure returned to baseline and stable Postop Assessment: no apparent nausea or vomiting Anesthetic complications: no   No complications documented.   Last Vitals:  Vitals:   11/09/19 1446 11/09/19 1457  BP: 114/77 118/71  Pulse:  63  Resp:  16  Temp: (!) 36.4 C 36.5 C  SpO2:  100%    Last Pain:  Vitals:   11/10/19 0958  TempSrc:   PainSc: 8                  Jarryn Altland Harvie Heck

## 2020-09-26 ENCOUNTER — Other Ambulatory Visit: Payer: Self-pay | Admitting: Neurology

## 2020-09-26 DIAGNOSIS — G43109 Migraine with aura, not intractable, without status migrainosus: Secondary | ICD-10-CM

## 2020-10-09 ENCOUNTER — Other Ambulatory Visit: Payer: Self-pay

## 2020-10-09 ENCOUNTER — Ambulatory Visit
Admission: RE | Admit: 2020-10-09 | Discharge: 2020-10-09 | Disposition: A | Discharge status: Home / Self Care | Payer: BC Managed Care – PPO | Source: Ambulatory Visit | Attending: Neurology | Admitting: Neurology

## 2020-10-09 DIAGNOSIS — G43109 Migraine with aura, not intractable, without status migrainosus: Secondary | ICD-10-CM | POA: Diagnosis present

## 2020-10-09 MED ORDER — GADOBUTROL 1 MMOL/ML IV SOLN
7.0000 mL | Freq: Once | INTRAVENOUS | Status: AC | PRN
  Administered 2020-10-09: 7 mL via INTRAVENOUS

## 2021-09-18 ENCOUNTER — Emergency Department: Payer: BC Managed Care – PPO

## 2021-09-18 ENCOUNTER — Encounter: Payer: Self-pay | Admitting: *Deleted

## 2021-09-18 ENCOUNTER — Other Ambulatory Visit: Payer: Self-pay

## 2021-09-18 DIAGNOSIS — U071 COVID-19: Secondary | ICD-10-CM | POA: Insufficient documentation

## 2021-09-18 DIAGNOSIS — R112 Nausea with vomiting, unspecified: Secondary | ICD-10-CM | POA: Diagnosis present

## 2021-09-18 LAB — CBC
HCT: 40.4 % (ref 39.0–52.0)
Hemoglobin: 14.1 g/dL (ref 13.0–17.0)
MCH: 30.2 pg (ref 26.0–34.0)
MCHC: 34.9 g/dL (ref 30.0–36.0)
MCV: 86.5 fL (ref 80.0–100.0)
Platelets: 150 10*3/uL (ref 150–400)
RBC: 4.67 MIL/uL (ref 4.22–5.81)
RDW: 14.5 % (ref 11.5–15.5)
WBC: 7.8 10*3/uL (ref 4.0–10.5)
nRBC: 0 % (ref 0.0–0.2)

## 2021-09-18 LAB — SARS CORONAVIRUS 2 BY RT PCR: SARS Coronavirus 2 by RT PCR: POSITIVE — AB

## 2021-09-18 LAB — BASIC METABOLIC PANEL
Anion gap: 14 (ref 5–15)
BUN: 10 mg/dL (ref 6–20)
CO2: 23 mmol/L (ref 22–32)
Calcium: 9.3 mg/dL (ref 8.9–10.3)
Chloride: 98 mmol/L (ref 98–111)
Creatinine, Ser: 1.44 mg/dL — ABNORMAL HIGH (ref 0.61–1.24)
GFR, Estimated: >60 mL/min (ref >60)
Glucose, Bld: 127 mg/dL — ABNORMAL HIGH (ref 70–99)
Potassium: 3.5 mmol/L (ref 3.5–5.1)
Sodium: 135 mmol/L (ref 135–145)

## 2021-09-18 NOTE — ED Triage Notes (Signed)
Pt reports positive covid home test yesterday.  Pt has n/v/d  pt diaphoretic.  Pt has a cough .  Pt pale.  Pt alert sitting in wheelchair.

## 2021-09-19 ENCOUNTER — Emergency Department
Admission: EM | Admit: 2021-09-19 | Discharge: 2021-09-19 | Disposition: A | Discharge status: Home / Self Care | Payer: BC Managed Care – PPO | Attending: Emergency Medicine | Admitting: Emergency Medicine

## 2021-09-19 DIAGNOSIS — U071 COVID-19: Secondary | ICD-10-CM

## 2021-09-19 MED ORDER — PROMETHAZINE HCL 25 MG RE SUPP: 25.0000 mg | Freq: Four times a day (QID) | RECTAL | 0 refills | Status: DC | PRN

## 2021-09-19 MED ORDER — PROCHLORPERAZINE MALEATE 10 MG PO TABS: 10.0000 mg | ORAL_TABLET | Freq: Three times a day (TID) | ORAL | 0 refills | Status: AC | PRN

## 2021-09-19 MED ORDER — PROCHLORPERAZINE EDISYLATE 10 MG/2ML IJ SOLN
10.0000 mg | Freq: Once | INTRAMUSCULAR | Status: AC
  Administered 2021-09-19: 10 mg via INTRAVENOUS
  Filled 2021-09-19: qty 2

## 2021-09-19 MED ORDER — SODIUM CHLORIDE 0.9 % IV BOLUS
1000.0000 mL | Freq: Once | INTRAVENOUS | Status: AC
  Administered 2021-09-19: 1000 mL via INTRAVENOUS

## 2021-09-19 MED ORDER — PROCHLORPERAZINE EDISYLATE 10 MG/2ML IJ SOLN
10.0000 mg | Freq: Once | INTRAMUSCULAR | Status: AC
Start: 2021-09-19 — End: 2021-09-19
  Administered 2021-09-19: 10 mg via INTRAVENOUS
  Filled 2021-09-19: qty 2

## 2021-09-19 NOTE — Discharge Instructions (Addendum)
Please seek medical attention for any high fevers, chest pain, shortness of breath, change in behavior, persistent vomiting, or any other new or concerning symptoms.

## 2021-09-19 NOTE — ED Provider Notes (Signed)
[facility] Provider Note    Event Date/Time   First MD Initiated Contact with Patient 09/19/21 [phone]     (approximate)   History   Covid Exposure   HPI  Carl Solis is a 36 y.o. male  who presents to the emergency department today because of concern for nausea, vomiting in the setting of recent covid positive test. The patient says that anytime he tries to take anything PO he either vomits it up or it comes out the other end within ten minutes. Because of this he has not been able to take his home medications, which consist of both mental health medication and chronic pain medication. He has had fevers. Has only had shortness of breath when he has worn a mask.   Physical Exam   Triage Vital Signs: ED Triage Vitals  Enc Vitals Group     BP 09/18/21 2201 (!) 134/99     Pulse Rate 09/18/21 2201 (!) 106     Resp 09/18/21 2201 20     Temp 09/18/21 2201 98.9 F (37.2 C)     Temp Source 09/18/21 2201 Oral     SpO2 09/18/21 2201 98 %     Weight 09/18/21 2202 173 lb (78.5 kg)     Height 09/18/21 2202 '5\' 10"'$  (1.778 m)     Head Circumference --      Peak Flow --      Pain Score 09/18/21 2202 8     Pain Loc --      Pain Edu? --      Excl. in [place]? --     Most recent vital signs: Vitals:   09/18/21 2201 09/19/21 0159  BP: (!) 134/99 (!) 126/92  Pulse: (!) 106 (!) 56  Resp: 20 (!) 22  Temp: 98.9 F (37.2 C) 99.3 F (37.4 C)  SpO2: 98% 99%    General: Awake, alert, oriented. Resp:  Normal effort.  Abd:  No active vomiting   ED Results / Procedures / Treatments   Labs (all labs ordered are listed, but only abnormal results are displayed) Labs Reviewed  SARS CORONAVIRUS 2 BY RT PCR - Abnormal; Notable for the following components:      Result Value   SARS Coronavirus 2 by RT PCR POSITIVE (*)    All other components within normal limits  BASIC METABOLIC PANEL - Abnormal; Notable for the following components:   Glucose, Bld 127 (*)     Creatinine, Ser 1.44 (*)    All other components within normal limits  CBC     EKG  None   RADIOLOGY I independently interpreted and visualized the CXR. My interpretation: No pneumonia. Radiology interpretation:  IMPRESSION:  No active cardiopulmonary disease.     PROCEDURES:  Critical Care performed: No  Procedures   MEDICATIONS ORDERED IN ED: Medications - No data to display   IMPRESSION / MDM / [place] / ED COURSE  I reviewed the triage vital signs and the nursing notes.                              Differential diagnosis includes, but is not limited to, COVID, gastroenteritis.  Patient's presentation is most consistent with acute presentation with potential threat to life or bodily function.  Patient presented to the emergency department today because of concerns for nausea vomiting and diarrhea in the setting of recent COVID-positive test.  Blood work here  without significant electrolyte abnormality or signs of concerning dehydration.  He was given IV fluids and nausea medicine here in the emergency department and did feel better.  We will plan on discharging with nausea medications.  Patient has already been given antivirals by primary care.   FINAL CLINICAL IMPRESSION(S) / ED DIAGNOSES   Final diagnoses:  COVID-19     Note:  This document was prepared using Dragon voice recognition software and may include unintentional dictation errors.    Nance Pear, MD 09/19/21 [phone]

## 2022-08-14 ENCOUNTER — Ambulatory Visit
Admission: RE | Admit: 2022-08-14 | Discharge: 2022-08-14 | Disposition: A | Discharge status: Home / Self Care | Payer: BC Managed Care – PPO | Source: Ambulatory Visit | Attending: Family Medicine | Admitting: Family Medicine

## 2022-08-14 ENCOUNTER — Other Ambulatory Visit: Payer: Self-pay | Admitting: Family Medicine

## 2022-08-14 DIAGNOSIS — Z87442 Personal history of urinary calculi: Secondary | ICD-10-CM | POA: Insufficient documentation

## 2022-08-14 DIAGNOSIS — R1032 Left lower quadrant pain: Secondary | ICD-10-CM | POA: Diagnosis present

## 2022-08-14 DIAGNOSIS — R112 Nausea with vomiting, unspecified: Secondary | ICD-10-CM

## 2022-08-14 DIAGNOSIS — R109 Unspecified abdominal pain: Secondary | ICD-10-CM | POA: Diagnosis present

## 2023-01-10 ENCOUNTER — Other Ambulatory Visit: Payer: Self-pay | Admitting: Medical Genetics

## 2023-02-15 ENCOUNTER — Ambulatory Visit
Admission: RE | Admit: 2023-02-15 | Discharge: 2023-02-15 | Disposition: A | Discharge status: Home / Self Care | Payer: BC Managed Care – PPO | Source: Ambulatory Visit | Attending: Emergency Medicine | Admitting: Emergency Medicine

## 2023-02-15 VITALS — BP 146/88 | HR 100 | Temp 98.5°F | Resp 18

## 2023-02-15 DIAGNOSIS — R350 Frequency of micturition: Secondary | ICD-10-CM

## 2023-02-15 DIAGNOSIS — R300 Dysuria: Secondary | ICD-10-CM

## 2023-02-15 DIAGNOSIS — Z87442 Personal history of urinary calculi: Secondary | ICD-10-CM

## 2023-02-15 LAB — URINALYSIS, W/ REFLEX TO CULTURE (INFECTION SUSPECTED)
Glucose, UA: NEGATIVE mg/dL
Leukocytes,Ua: NEGATIVE
Nitrite: NEGATIVE
Specific Gravity, Urine: >1.03 — ABNORMAL HIGH (ref 1.005–1.030)
pH: 5.5 (ref 5.0–8.0)

## 2023-02-15 MED ORDER — PHENAZOPYRIDINE HCL 200 MG PO TABS: 200.0000 mg | ORAL_TABLET | Freq: Three times a day (TID) | ORAL | 0 refills | Status: DC | PRN

## 2023-02-15 MED ORDER — NITROFURANTOIN MONOHYD MACRO 100 MG PO CAPS: 100.0000 mg | ORAL_CAPSULE | Freq: Two times a day (BID) | ORAL | 0 refills | Status: AC

## 2023-02-15 NOTE — ED Provider Notes (Signed)
 HPI  SUBJECTIVE:  Carl Solis is a 38 y.o. male who presents with urinary urgency, frequency, dysuria starting after passing a stone 5 to 6 days ago.  States that he normally feels fine after passing a stone.  No cloudy odorous urine, hematuria.  No nausea vomiting..  Tramadol, back, pelvic pain.  No penile rash, discharge, testicular pain or swelling.  He is in a long-term monogamous relationship with his husband, who is asymptomatic.  He describes himself as versatile.  He states that he has not been sexually active in 2 months.  STDs are not a concern today.  No antibiotics in the past month.  No antipyretic in the past 6 hours.  He tried 1 dose of Tylenol  with improvement in his symptoms.  Symptoms worse with urinating.  He has a past medical history of nonobstructing nephrolithiasis, AVN of bilateral hips and shoulders, is on a DMARD for ankylosing spondylitis. No history of prostatitis, epididymitis, orchitis, BPH.  PCP: [facility].  Urology: [facility].   Past Medical History:  Diagnosis Date   Anxiety    Avascular necrosis (HCC)    Avascular necrosis (HCC) 08/02/2018   Chronic pain syndrome 08/02/2018   Chronic, continuous use of opioids 08/02/2018   Depression    Hip pain, bilateral 08/02/2018   History of kidney stones    Migraine    Migraines    Pre-diabetes     Past Surgical History:  Procedure Laterality Date   ADENOIDECTOMY     CHOLECYSTECTOMY N/A 07/19/2018   Procedure: LAPAROSCOPIC CHOLECYSTECTOMY;  Surgeon: Rodolph Romano, MD;  Location: [facility] ORS;  Service: General;  Laterality: N/A;   HIP SURGERY Left    MASS EXCISION N/A 11/09/2019   Procedure: EXCISION cyst on head;  Surgeon: Tye Millet, DO;  Location: [facility] OR[facility];  Service: General;  Laterality: N/A;   TOTAL SHOULDER ARTHROPLASTY Right 11/09/2018   Procedure: RIGHT SHOULDER HEMIARTHROPLASTY;  Surgeon: Leora Lynwood SAUNDERS, MD;  Location: [facility] OR[facility];  Service: Orthopedics;  Laterality: Right;    Family History   Problem Relation Age of Onset   Brain cancer Father    Depression Mother    Anxiety disorder Mother     Social History   Tobacco Use   Smoking status: Former    Current packs/day: 0.10    Average packs/day: 0.1 packs/day for 10.0 years (1.0 ttl pk-yrs)    Types: Cigarettes   Smokeless tobacco: Never  Vaping Use   Vaping status: Every Day  Substance Use Topics   Alcohol use: Not Currently   Drug use: No    No current facility-administered medications for this encounter.  Current Outpatient Medications:    famotidine  (PEPCID ) 20 MG tablet, Take 20 mg by mouth 2 (two) times daily., Disp: , Rfl:    fentaNYL  (DURAGESIC ) 75 MCG/HR, Place onto the skin every 3 (three) days., Disp: , Rfl:    HYDROmorphone  (DILAUDID ) 4 MG tablet, Take by mouth every 4 (four) hours as needed for severe pain (pain score 7-10)., Disp: , Rfl:    Lurasidone HCl 60 MG TABS, Take by mouth., Disp: , Rfl:    methocarbamol (ROBAXIN) 750 MG tablet, Take 750 mg by mouth 4 (four) times daily., Disp: , Rfl:    methylphenidate (RITALIN) 5 MG tablet, Take 5 mg by mouth 2 (two) times daily., Disp: , Rfl:    nitrofurantoin , macrocrystal-monohydrate, (MACROBID ) 100 MG capsule, Take 1 capsule (100 mg total) by mouth 2 (two) times daily for 7 days., Disp: 14 capsule, Rfl: 0  ondansetron  (ZOFRAN ) 4 MG tablet, Take 4 mg by mouth every 8 (eight) hours as needed for nausea or vomiting., Disp: , Rfl:    phenazopyridine  (PYRIDIUM ) 200 MG tablet, Take 1 tablet (200 mg total) by mouth 3 (three) times daily as needed for pain., Disp: 6 tablet, Rfl: 0   prazosin (MINIPRESS) 2 MG capsule, Take 2 mg by mouth at bedtime., Disp: , Rfl:    QUEtiapine  (SEROQUEL ) 50 MG tablet, Take 50 mg by mouth at bedtime., Disp: , Rfl:    Upadacitinib (RINVOQ PO), Take by mouth., Disp: , Rfl:    ARIPiprazole  (ABILIFY ) 5 MG tablet, Take 5 mg by mouth daily., Disp: , Rfl:    celecoxib (CELEBREX) 200 MG capsule, Take 200 mg by mouth every morning., Disp: ,  Rfl:    cetirizine (ZYRTEC) 10 MG tablet, Take 10 mg by mouth daily., Disp: , Rfl:    citalopram  (CELEXA ) 40 MG tablet, Take 40 mg by mouth daily. , Disp: , Rfl:    diazepam  (VALIUM ) 5 MG tablet, Take 5 mg by mouth every 8 (eight) hours as needed for anxiety. , Disp: , Rfl:    diclofenac sodium (VOLTAREN) 1 % GEL, Apply 2 g topically 4 (four) times daily as needed (shoulder pain)., Disp: , Rfl:    EPINEPHrine  0.3 mg/0.3 mL IJ SOAJ injection, Inject 0.3 mLs into the skin as needed for anaphylaxis. , Disp: , Rfl:    fluticasone  (FLONASE ) 50 MCG/ACT nasal spray, Place 2 sprays into both nostrils daily. , Disp: , Rfl:    HYDROcodone -acetaminophen  (NORCO) 5-325 MG tablet, Take 1 tablet by mouth every 6 (six) hours as needed for up to 6 doses for moderate pain., Disp: 6 tablet, Rfl: 0   pramipexole (MIRAPEX) 1 MG tablet, Take 1 mg by mouth at bedtime., Disp: , Rfl:    prochlorperazine  (COMPAZINE ) 10 MG tablet, Take 1 tablet (10 mg total) by mouth every 8 (eight) hours as needed for vomiting or nausea., Disp: 15 tablet, Rfl: 0   promethazine  (PHENERGAN ) 25 MG suppository, Place 1 suppository (25 mg total) rectally every 6 (six) hours as needed for nausea or vomiting., Disp: 12 each, Rfl: 0  Allergies  Allergen Reactions   Bee Venom Anaphylaxis   D.H.E. [Dihydroergotamine] Swelling and Anaphylaxis    Lack Jaw, Rash, Hives   Gabapentin  Diarrhea   Latex Hives and Rash   Toradol [Ketorolac Tromethamine] Hives and Rash    All over (including mouth)   Amoxicillin  Diarrhea   Dhea     Other reaction(s): Other (See Comments) retain water in legs which prevents me from walking   Eletriptan     Other reaction(s): Other (See Comments) lock jaw where I can't eat or drink   Pork-Derived Products     Other reaction(s): Headache   Rizatriptan     Other reaction(s): Other (See Comments) Lock jaw, rash, hives   Sulfa Antibiotics Diarrhea   Sumatriptan Other (See Comments)    lock jaw where I can't eat  or drink   Triptans Other (See Comments)    Lock jaw   Warfarin Other (See Comments)    MD instructed that he would react to this medication.   Aspartame And Phenylalanine     headaches   Bacitracin -Neomycin-Polymyxin Hives and Rash   Benzalkonium Chloride Rash   Ketorolac Hives and Rash   Neosporin [Neomycin-Bacitracin  Zn-Polymyx] Rash     ROS  As noted in HPI.   Physical Exam  BP (!) 146/88 (BP Location: Left Arm)  Pulse 100   Temp 98.5 F (36.9 C) (Oral)   Resp 18   SpO2 100%   Constitutional: Well developed, well nourished, no acute distress Eyes:  EOMI, conjunctiva normal bilaterally HENT: Normocephalic, atraumatic,mucus membranes moist Respiratory: Normal inspiratory effort Cardiovascular: Normal rate GI: nondistended.  Soft.  No suprapubic, flank tenderness. Back: No CVAT Rectal: Normal tone.  Normal size, firm, nontender prostate.  Chaperone present during exam skin: No rash, skin intact Musculoskeletal: no deformities Neurologic: Alert & oriented x 3, no focal neuro deficits Psychiatric: Speech and behavior appropriate   ED Course   Medications - No data to display  Orders Placed This Encounter  Procedures   Urine Culture    Standing Status:   Standing    Number of Occurrences:   1    Indication:   Dysuria   Urinalysis, w/ Reflex to Culture (Infection Suspected) -Urine, Clean Catch    Standing Status:   Standing    Number of Occurrences:   1    Specimen Source:   Urine, Clean Catch [76]    Results for orders placed or performed during the hospital encounter of 02/15/23 (from the past 24 hours)  Urinalysis, w/ Reflex to Culture (Infection Suspected) -Urine, Clean Catch     Status: Abnormal   Collection Time: 02/15/23 10:51 AM  Result Value Ref Range   Specimen Source URINE, CLEAN CATCH    Color, Urine YELLOW YELLOW   APPearance CLEAR CLEAR   Specific Gravity, Urine >1.030 (H) 1.005 - 1.030   pH 5.5 5.0 - 8.0   Glucose, UA NEGATIVE NEGATIVE  mg/dL   Hgb urine dipstick SMALL (A) NEGATIVE   Bilirubin Urine SMALL (A) NEGATIVE   Ketones, ur TRACE (A) NEGATIVE mg/dL   Protein, ur TRACE (A) NEGATIVE mg/dL   Nitrite NEGATIVE NEGATIVE   Leukocytes,Ua NEGATIVE NEGATIVE   Squamous Epithelial / HPF 0-5 0 - 5 /HPF   WBC, UA 0-5 0 - 5 WBC/hpf   RBC / HPF 11-20 0 - 5 RBC/hpf   Bacteria, UA FEW (A) NONE SEEN   Mucus PRESENT    Ca Oxalate Crys, UA PRESENT    No results found.  ED Clinical Impression  1. Dysuria   2. Urinary frequency   3. History of nephrolithiasis      ED Assessment/Plan      UA positive for small hematuria, bilirubin, trace ketones, protein and is concentrated.  Few bacteria, negative nitrites, esterase.  No pyuria.  He could have a urethritis from passing a recent stone.  doubt STD given that he is in a long-term monogamous relationship with a male partner, who is asymptomatic and he has not had intercourse in 2 months, thus testing was not done.  He has no evidence of prostatitis on physical exam.  Doubt pyelonephritis.  Will send home with Macrobid  100 mg p.o. twice daily for 7 days per up-to-date recommendations, Pyridium .  He will discontinue the antibiotics if his urine culture is negative.  Push extra fluids.  Follow-up with PCP as needed.  Written ER return precautions given.  Discussed labs,  MDM, treatment plan, and plan for follow-up with patient. Discussed sn/sx that should prompt return to the ED. patient agrees with plan.   Meds ordered this encounter  Medications   nitrofurantoin , macrocrystal-monohydrate, (MACROBID ) 100 MG capsule    Sig: Take 1 capsule (100 mg total) by mouth 2 (two) times daily for 7 days.    Dispense:  14 capsule    Refill:  0  phenazopyridine  (PYRIDIUM ) 200 MG tablet    Sig: Take 1 tablet (200 mg total) by mouth 3 (three) times daily as needed for pain.    Dispense:  6 tablet    Refill:  0      *This clinic note was created using Scientist, clinical (histocompatibility and immunogenetics). Therefore,  there may be occasional mistakes despite careful proofreading.  ?    Van Knee, MD 02/18/23 1525

## 2023-02-15 NOTE — Discharge Instructions (Signed)
 I have sent your urine off for culture to make sure you do not have a urinary tract infection.  Discontinue the Macrobid  if your culture comes back negative for an infection.  Pyridium  will turn your urine orange, but will alleviate your symptoms.  Drink extra fluids.  Go to the emergency department for fevers above 100.4, perineal, back, pelvic, abdominal pain, vomiting, or for other concerns.

## 2023-02-15 NOTE — ED Triage Notes (Signed)
 Patient c/o urinary symptoms x 5 days after passing a kidney stone.

## 2023-02-16 LAB — URINE CULTURE: Culture: <10000 — AB

## 2023-02-27 ENCOUNTER — Emergency Department: Payer: BC Managed Care – PPO

## 2023-02-27 ENCOUNTER — Emergency Department
Admission: EM | Admit: 2023-02-27 | Discharge: 2023-02-27 | Disposition: A | Discharge status: Home / Self Care | Payer: BC Managed Care – PPO | Attending: Emergency Medicine | Admitting: Emergency Medicine

## 2023-02-27 ENCOUNTER — Other Ambulatory Visit: Payer: Self-pay

## 2023-02-27 ENCOUNTER — Encounter: Payer: Self-pay | Admitting: Intensive Care

## 2023-02-27 DIAGNOSIS — R109 Unspecified abdominal pain: Secondary | ICD-10-CM | POA: Diagnosis present

## 2023-02-27 DIAGNOSIS — N200 Calculus of kidney: Secondary | ICD-10-CM | POA: Diagnosis not present

## 2023-02-27 HISTORY — DX: Unspecified osteoarthritis, unspecified site: M19.90

## 2023-02-27 LAB — CBC
HCT: 38.6 % — ABNORMAL LOW (ref 39.0–52.0)
Hemoglobin: 12.7 g/dL — ABNORMAL LOW (ref 13.0–17.0)
MCH: 28.2 pg (ref 26.0–34.0)
MCHC: 32.9 g/dL (ref 30.0–36.0)
MCV: 85.6 fL (ref 80.0–100.0)
Platelets: 227 10*3/uL (ref 150–400)
RBC: 4.51 MIL/uL (ref 4.22–5.81)
RDW: 13.6 % (ref 11.5–15.5)
WBC: 4.8 10*3/uL (ref 4.0–10.5)
nRBC: 0 % (ref 0.0–0.2)

## 2023-02-27 LAB — BASIC METABOLIC PANEL
Anion gap: 6 (ref 5–15)
BUN: 6 mg/dL (ref 6–20)
CO2: 30 mmol/L (ref 22–32)
Calcium: 9.2 mg/dL (ref 8.9–10.3)
Chloride: 105 mmol/L (ref 98–111)
Creatinine, Ser: 1.06 mg/dL (ref 0.61–1.24)
GFR, Estimated: >60 mL/min (ref >60)
Glucose, Bld: 112 mg/dL — ABNORMAL HIGH (ref 70–99)
Potassium: 4.1 mmol/L (ref 3.5–5.1)
Sodium: 141 mmol/L (ref 135–145)

## 2023-02-27 LAB — URINALYSIS, ROUTINE W REFLEX MICROSCOPIC
Bilirubin Urine: NEGATIVE
Glucose, UA: NEGATIVE mg/dL
Hgb urine dipstick: NEGATIVE
Ketones, ur: NEGATIVE mg/dL
Leukocytes,Ua: NEGATIVE
Nitrite: NEGATIVE
Protein, ur: NEGATIVE mg/dL
Specific Gravity, Urine: 1.008 (ref 1.005–1.030)
pH: 6 (ref 5.0–8.0)

## 2023-02-27 MED ORDER — ACETAMINOPHEN 500 MG PO TABS
1000.0000 mg | ORAL_TABLET | Freq: Once | ORAL | Status: AC
  Administered 2023-02-27: 1000 mg via ORAL
  Filled 2023-02-27: qty 2

## 2023-02-27 MED ORDER — OXYCODONE HCL 5 MG PO TABS
5.0000 mg | ORAL_TABLET | Freq: Once | ORAL | Status: AC
  Administered 2023-02-27: 5 mg via ORAL
  Filled 2023-02-27: qty 1

## 2023-02-27 MED ORDER — OXYCODONE HCL 5 MG PO TABS: 5.0000 mg | ORAL_TABLET | Freq: Three times a day (TID) | ORAL | 0 refills | Status: DC | PRN

## 2023-02-27 NOTE — ED Notes (Signed)
Pt contact made and myself introduced. Pt is CAOx4, breathing normally, and normal in color. Pt is sitting upright in a chair and does not appear to be in any distress at this time.

## 2023-02-27 NOTE — ED Provider Notes (Signed)
[facility] Provider Note    Event Date/Time   First MD Initiated Contact with Patient 02/27/23 2151     (approximate)   History   Flank Pain   HPI  Carl Solis is a 38 y.o. male history of prior kidney stones, migraines, presenting with left flank pain for 2 weeks.  He denies any burning with urination but noticed increased urination.  No fever, vomiting, diarrhea.  No abdominal pain.  On independent chart review he was seen by his primary care doctor in November of last year, history of anxiety, hyperlipidemia, chronic headaches, does have allergies to ibuprofen and NSAIDs.     Physical Exam   Triage Vital Signs: ED Triage Vitals  Encounter Vitals Group     BP 02/27/23 1819 (!) 137/90     Systolic BP Percentile --      Diastolic BP Percentile --      Pulse Rate 02/27/23 1819 (!) 120     Resp 02/27/23 1819 16     Temp 02/27/23 1819 98.6 F (37 C)     Temp Source 02/27/23 1819 Oral     SpO2 02/27/23 1819 100 %     Weight 02/27/23 1820 180 lb (81.6 kg)     Height 02/27/23 1820 5\' 10"  (1.778 m)     Head Circumference --      Peak Flow --      Pain Score 02/27/23 1820 7     Pain Loc --      Pain Education --      Exclude from Growth Chart --     Most recent vital signs: Vitals:   02/27/23 1819 02/27/23 2152  BP: (!) 137/90 124/89  Pulse: (!) 120 75  Resp: 16 16  Temp: 98.6 F (37 C) 98.4 F (36.9 C)  SpO2: 100% 99%     General: Awake, no distress.  CV:  Good peripheral perfusion.  Resp:  Normal effort.  Abd:  No distention.  Soft nontender Other:  No CVA tenderness, no tenderness to his flank or back.  No overlying rash   ED Results / Procedures / Treatments   Labs (all labs ordered are listed, but only abnormal results are displayed) Labs Reviewed  URINALYSIS, ROUTINE W REFLEX MICROSCOPIC - Abnormal; Notable for the following components:      Result Value   Color, Urine YELLOW (*)    APPearance CLEAR (*)     All other components within normal limits  BASIC METABOLIC PANEL - Abnormal; Notable for the following components:   Glucose, Bld 112 (*)    All other components within normal limits  CBC - Abnormal; Notable for the following components:   Hemoglobin 12.7 (*)    HCT 38.6 (*)    All other components within normal limits     RADIOLOGY My CT interpretation without obvious free air   PROCEDURES:  Critical Care performed: No  Procedures   MEDICATIONS ORDERED IN ED: Medications  acetaminophen (TYLENOL) tablet 1,000 mg (1,000 mg Oral Given 02/27/23 2248)  oxyCODONE (Oxy IR/ROXICODONE) immediate release tablet 5 mg (5 mg Oral Given 02/27/23 2249)     IMPRESSION / MDM / ASSESSMENT AND PLAN / ED COURSE  I reviewed the triage vital signs and the nursing notes.                              Differential diagnosis includes, but is not limited  to, nephrolithiasis, musculoskeletal pain, strain, considered UTI and pyelonephritis but patient has no dysuria or CVA tenderness.  Will get labs, UA, CT.  Will give him some Tylenol and oxycodone.  Patient's presentation is most consistent with acute presentation with potential threat to life or bodily function.  Independent review of labs and imaging, no leukocytosis, creatinine is normal, no electrolyte derangements, UA is clear, CT showed bilateral nonobstructing 3 mm stones.  Shared decision making done with patient he is agreeable plan for discharge.  Will give him a referral for urology follow-up.  Considered but no indication for inpatient mission at this time, he is safe for outpatient management.  Will discharge with strict return precautions.      FINAL CLINICAL IMPRESSION(S) / ED DIAGNOSES   Final diagnoses:  Flank pain  Nephrolithiasis     Rx / DC Orders   ED Discharge Orders          Ordered    oxyCODONE (ROXICODONE) 5 MG immediate release tablet  Every 8 hours PRN        02/27/23 2249             Note:  This  document was prepared using Dragon voice recognition software and may include unintentional dictation errors.    Claybon Jabs, MD 02/27/23 [phone]

## 2023-02-27 NOTE — Discharge Instructions (Addendum)
You can take 650 mg of Tylenol every 6 hours as needed for pain.  Please reserve the oxycodone for severe breakthrough pain that is not helped with the Tylenol.  Please do not drive or operate heavy machinery when you are taking the oxycodone.  Please follow-up with urology for further management of her symptoms.  Please return if you have fevers, worsening pain despite the medications, burning with urination, or if you have any additional concerns

## 2023-02-27 NOTE — ED Triage Notes (Signed)
Patient c/o left sided flank pain X2 weeks that has gradually worsened. Frequent urination

## 2023-03-10 ENCOUNTER — Encounter: Payer: Self-pay | Admitting: Urology

## 2023-03-10 ENCOUNTER — Ambulatory Visit
Admission: RE | Admit: 2023-03-10 | Discharge: 2023-03-10 | Disposition: A | Discharge status: Home / Self Care | Payer: BC Managed Care – PPO | Source: Ambulatory Visit | Attending: Urology | Admitting: Urology

## 2023-03-10 ENCOUNTER — Ambulatory Visit: Payer: BC Managed Care – PPO | Admitting: Urology

## 2023-03-10 ENCOUNTER — Telehealth: Payer: Self-pay

## 2023-03-10 ENCOUNTER — Ambulatory Visit
Admission: RE | Admit: 2023-03-10 | Discharge: 2023-03-10 | Disposition: A | Discharge status: Home / Self Care | Payer: BC Managed Care – PPO | Attending: Urology | Admitting: Urology

## 2023-03-10 ENCOUNTER — Other Ambulatory Visit
Admission: RE | Admit: 2023-03-10 | Discharge: 2023-03-10 | Disposition: A | Discharge status: Home / Self Care | Payer: BC Managed Care – PPO | Source: Home / Self Care | Attending: Urology | Admitting: Urology

## 2023-03-10 VITALS — BP 139/91 | HR 106 | Ht 70.0 in | Wt 193.0 lb

## 2023-03-10 DIAGNOSIS — R9349 Abnormal radiologic findings on diagnostic imaging of other urinary organs: Secondary | ICD-10-CM | POA: Diagnosis not present

## 2023-03-10 DIAGNOSIS — N200 Calculus of kidney: Secondary | ICD-10-CM | POA: Insufficient documentation

## 2023-03-10 DIAGNOSIS — R102 Pelvic and perineal pain: Secondary | ICD-10-CM

## 2023-03-10 DIAGNOSIS — N50812 Left testicular pain: Secondary | ICD-10-CM | POA: Diagnosis not present

## 2023-03-10 DIAGNOSIS — R109 Unspecified abdominal pain: Secondary | ICD-10-CM

## 2023-03-10 LAB — URINALYSIS, COMPLETE (UACMP) WITH MICROSCOPIC
Bacteria, UA: NONE SEEN
Bilirubin Urine: NEGATIVE
Glucose, UA: NEGATIVE mg/dL
Hgb urine dipstick: NEGATIVE
Ketones, ur: NEGATIVE mg/dL
Leukocytes,Ua: NEGATIVE
Nitrite: NEGATIVE
Protein, ur: NEGATIVE mg/dL
Specific Gravity, Urine: 1.01 (ref 1.005–1.030)
pH: 6.5 (ref 5.0–8.0)

## 2023-03-10 MED ORDER — DOXYCYCLINE HYCLATE 100 MG PO CAPS: 100.0000 mg | ORAL_CAPSULE | Freq: Two times a day (BID) | ORAL | 0 refills | Status: AC

## 2023-03-10 NOTE — Progress Notes (Signed)
 03/10/23 11:20 AM   Carl Solis 07/26/1985 [id number]  CC: Left groin pain, history of nephrolithiasis  HPI: 38 year old with chronic pain on narcotics, history of nephrolithiasis, who reports 5 weeks of left-sided groin and testicle pain.  He has had extensive workup including CT stone protocol in the ER on 02/27/2023 that showed no evidence of hydronephrosis or ureteral stones distal ureteral evaluation somewhat limited secondary to artifact from hip prostheses bilaterally.  He denies any significant urinary symptoms at this time, urinalysis last week was benign, and urinalysis today is completely benign.  He was started on Flomax  by PCP for possible stone.   PMH: Past Medical History:  Diagnosis Date   Anxiety    Arthritis    Avascular necrosis (HCC)    Avascular necrosis (HCC) 08/02/2018   Chronic pain syndrome 08/02/2018   Chronic, continuous use of opioids 08/02/2018   Depression    Hip pain, bilateral 08/02/2018   History of kidney stones    Migraine    Migraines    Pre-diabetes     Surgical History: Past Surgical History:  Procedure Laterality Date   ADENOIDECTOMY     CHOLECYSTECTOMY N/A 07/19/2018   Procedure: LAPAROSCOPIC CHOLECYSTECTOMY;  Surgeon: Rodolph Romano, MD;  Location: [facility] OR[facility];  Service: General;  Laterality: N/A;   HIP SURGERY Left    MASS EXCISION N/A 11/09/2019   Procedure: EXCISION cyst on head;  Surgeon: Tye Millet, DO;  Location: [facility] OR[facility];  Service: General;  Laterality: N/A;   TOTAL SHOULDER ARTHROPLASTY Right 11/09/2018   Procedure: RIGHT SHOULDER HEMIARTHROPLASTY;  Surgeon: Leora Lynwood SAUNDERS, MD;  Location: [facility] OR[facility];  Service: Orthopedics;  Laterality: Right;      Family History: Family History  Problem Relation Age of Onset   Brain cancer Father    Depression Mother    Anxiety disorder Mother     Social History:  reports that he has quit smoking. His smoking use included cigarettes and e-cigarettes. He has a 1  pack-year smoking history. He has never used smokeless tobacco. He reports that he does not currently use alcohol. He reports current drug use.  Physical Exam: BP (!) 139/91   Pulse (!) 106   Ht 5' 10 (1.778 m)   Wt 193 lb (87.5 kg)   BMI 27.69 kg/m    Constitutional:  Alert and oriented, No acute distress. Cardiovascular: No clubbing, cyanosis, or edema. Respiratory: Normal respiratory effort, no increased work of breathing. GI: Abdomen is soft, nontender, nondistended, no abdominal masses   Laboratory Data: Reviewed, see HPI  Pertinent Imaging: I have personally viewed and interpreted the CT scan dated 02/27/2023 showing no hydronephrosis, nonobstructive upper pole 3 mm stones bilaterally.  I also reviewed the KUB today that shows no evidence of new pelvic calcifications from prior x-ray in 2020 that could represent a distal ureteral stone not seen on CT secondary to artifact   Assessment & Plan:   38 year old male with chronic pain who reports left groin and left testicle pain of unclear etiology.  CT somewhat limited by artifact from hip prosthesis, however no hydronephrosis seen, and KUB today shows no evidence of new calcifications in the pelvis from prior x-ray in 2020.  Urinalysis is also benign.  With his persistent symptoms, not unreasonable to try a course of doxycycline  for possible epididymitis.  Reviewed extensively with him the challenges of diagnosing a small distal stone based on the artifact seen on CT from hip prosthesis and benign x-ray today, but in the setting of  normal urinalysis x 2 with no microscopic hematuria I think his symptoms are unlikely to be related to an occult ureteral stone.  Trial of doxycycline  100 mg twice daily x 7 days  Redell Burnet, MD 03/10/2023  [facility] [address] [place], [place] [address] [phone]

## 2023-03-10 NOTE — Telephone Encounter (Signed)
 Called pt informed him of the information below, pt voiced understanding. Declines antibiotics at this time.

## 2023-03-10 NOTE — Telephone Encounter (Signed)
-----   Message from Redell JAYSON Burnet sent at 03/10/2023  1:18 PM EST ----- Urinalysis is completely normal with no microscopic blood.  I also reviewed the x-ray, and there is no evidence of any stones, I compared it to a prior x-ray from 2020 and there are no new calcifications.  With his persistent symptoms, reasonable to try a 10-day course of doxycycline  100 mg twice daily for any possible infection, he can reach out if no improvement after the antibiotics, but from all our testing no evidence of a kidney stone as cause of his symptoms  Redell Burnet, MD 03/10/2023

## 2023-04-06 ENCOUNTER — Other Ambulatory Visit: Payer: Self-pay

## 2023-04-06 ENCOUNTER — Emergency Department
Admission: EM | Admit: 2023-04-06 | Discharge: 2023-04-06 | Disposition: A | Discharge status: Home / Self Care | Attending: Student in an Organized Health Care Education/Training Program | Admitting: Student in an Organized Health Care Education/Training Program

## 2023-04-06 ENCOUNTER — Emergency Department

## 2023-04-06 DIAGNOSIS — R1110 Vomiting, unspecified: Secondary | ICD-10-CM | POA: Insufficient documentation

## 2023-04-06 DIAGNOSIS — R1011 Right upper quadrant pain: Secondary | ICD-10-CM | POA: Diagnosis not present

## 2023-04-06 DIAGNOSIS — R1012 Left upper quadrant pain: Secondary | ICD-10-CM | POA: Diagnosis not present

## 2023-04-06 DIAGNOSIS — Z96643 Presence of artificial hip joint, bilateral: Secondary | ICD-10-CM | POA: Insufficient documentation

## 2023-04-06 DIAGNOSIS — R1032 Left lower quadrant pain: Secondary | ICD-10-CM | POA: Insufficient documentation

## 2023-04-06 DIAGNOSIS — R1031 Right lower quadrant pain: Secondary | ICD-10-CM | POA: Insufficient documentation

## 2023-04-06 DIAGNOSIS — K5900 Constipation, unspecified: Secondary | ICD-10-CM | POA: Insufficient documentation

## 2023-04-06 HISTORY — DX: Ankylosing spondylitis of lumbosacral region: M45.7

## 2023-04-06 LAB — URINALYSIS, ROUTINE W REFLEX MICROSCOPIC
Bilirubin Urine: NEGATIVE
Glucose, UA: NEGATIVE mg/dL
Hgb urine dipstick: NEGATIVE
Ketones, ur: NEGATIVE mg/dL
Leukocytes,Ua: NEGATIVE
Nitrite: NEGATIVE
Protein, ur: NEGATIVE mg/dL
Specific Gravity, Urine: 1.001 — ABNORMAL LOW (ref 1.005–1.030)
pH: 6 (ref 5.0–8.0)

## 2023-04-06 LAB — COMPREHENSIVE METABOLIC PANEL
ALT: 18 U/L (ref 0–44)
AST: 28 U/L (ref 15–41)
Albumin: 4.7 g/dL (ref 3.5–5.0)
Alkaline Phosphatase: 40 U/L (ref 38–126)
Anion gap: 11 (ref 5–15)
BUN: 10 mg/dL (ref 6–20)
CO2: 27 mmol/L (ref 22–32)
Calcium: 9.4 mg/dL (ref 8.9–10.3)
Chloride: 99 mmol/L (ref 98–111)
Creatinine, Ser: 1.29 mg/dL — ABNORMAL HIGH (ref 0.61–1.24)
GFR, Estimated: >60 mL/min (ref >60)
Glucose, Bld: 109 mg/dL — ABNORMAL HIGH (ref 70–99)
Potassium: 4.1 mmol/L (ref 3.5–5.1)
Sodium: 137 mmol/L (ref 135–145)
Total Bilirubin: 1.1 mg/dL (ref 0.0–1.2)
Total Protein: 7.1 g/dL (ref 6.5–8.1)

## 2023-04-06 LAB — CBC
HCT: 41.3 % (ref 39.0–52.0)
Hemoglobin: 13.9 g/dL (ref 13.0–17.0)
MCH: 28.3 pg (ref 26.0–34.0)
MCHC: 33.7 g/dL (ref 30.0–36.0)
MCV: 83.9 fL (ref 80.0–100.0)
Platelets: 170 10*3/uL (ref 150–400)
RBC: 4.92 MIL/uL (ref 4.22–5.81)
RDW: 13 % (ref 11.5–15.5)
WBC: 4.4 10*3/uL (ref 4.0–10.5)
nRBC: 0 % (ref 0.0–0.2)

## 2023-04-06 LAB — LIPASE, BLOOD: Lipase: 25 U/L (ref 11–51)

## 2023-04-06 MED ORDER — ONDANSETRON 4 MG PO TBDP
4.0000 mg | ORAL_TABLET | Freq: Once | ORAL | Status: AC
  Administered 2023-04-06: 4 mg via ORAL
  Filled 2023-04-06: qty 1

## 2023-04-06 MED ORDER — LACTULOSE 10 GM/15ML PO SOLN
20.0000 g | Freq: Once | ORAL | Status: AC
  Administered 2023-04-06: 20 g via ORAL
  Filled 2023-04-06: qty 30

## 2023-04-06 MED ORDER — LACTULOSE 10 GM/15ML PO SOLN: 30.0000 g | Freq: Every day | ORAL | 0 refills | Status: AC | PRN

## 2023-04-06 MED ORDER — ONDANSETRON 4 MG PO TBDP: 4.0000 mg | ORAL_TABLET | Freq: Three times a day (TID) | ORAL | 0 refills | Status: AC | PRN

## 2023-04-06 NOTE — ED Triage Notes (Signed)
 Constipation since 5 days ago (LBM 5d ago) and L sided abdominal pain, upper and lower Has tried miralax, enemas, suppositories Stools are small and "rock hard" Vomited once today  Pt takes L/A fentanyl patch and 4mg  dilaudid TID since 1 year Does not take routine stool softener or laxative--only PRN For avascular necrosis (L shoulder) and ankylosing spondylitis

## 2023-04-06 NOTE — ED Notes (Signed)
 Pt has kept down a cup of water.  Gave pt a coke. Visitor supportive at the bedside.  No BM yet.

## 2023-04-06 NOTE — Discharge Instructions (Signed)
 Your labs are reassuring and your x-ray confirms a stool burden primarily on the right side.  Take the lactulose as directed.  Start a liquid diet and advance as tolerated.  Follow-up with your primary provider or return to the ED necessary.

## 2023-04-06 NOTE — ED Provider Notes (Signed)
 [facility] Emergency Department Provider Note     Event Date/Time   First MD Initiated Contact with Patient 04/06/23 1216     (approximate)   History   Constipation, Emesis, and Abdominal Pain   HPI  Carl Solis is a 38 y.o. male with a history of anxiety, AVN of the left shoulder, bilateral hip replacement, chronic pain syndrome and chronic opioid use, who presents to the ED for evaluation of constipation.  Patient uses long-acting fentanyl patches as well as 4 mg of Dilaudid 3 times daily for his pain management.  Patient endorses last BM was 5 days prior.  Since that time he has had some left-sided abdominal discomfort in the upper and lower quadrants.  He has tried OTC MiraLAX and sporadic doses, as well as an OTC enema at suppositories with limited benefit.  He is only been able to pass a small amount of "rock hard" stool balls.  He endorses 1 episode of nonbloody, nonmucoid bilious emesis today.   Physical Exam   Triage Vital Signs: ED Triage Vitals  Encounter Vitals Group     BP 04/06/23 1154 (!) 140/88     Systolic BP Percentile --      Diastolic BP Percentile --      Pulse Rate 04/06/23 1154 (!) 111     Resp 04/06/23 1154 20     Temp 04/06/23 1154 98.7 F (37.1 C)     Temp Source 04/06/23 1154 Oral     SpO2 04/06/23 1154 100 %     Weight 04/06/23 1153 193 lb (87.5 kg)     Height 04/06/23 1153 5\' 10"  (1.778 m)     Head Circumference --      Peak Flow --      Pain Score 04/06/23 1155 7     Pain Loc --      Pain Education --      Exclude from Growth Chart --     Most recent vital signs: Vitals:   04/06/23 1154  BP: (!) 140/88  Pulse: (!) 111  Resp: 20  Temp: 98.7 F (37.1 C)  SpO2: 100%    General Awake, no distress.  NAD HEENT NCAT. PERRL. EOMI. No rhinorrhea. Mucous membranes are moist.  CV:  Good peripheral perfusion.  RRR RESP:  Normal effort.  CTA ABD:  No distention.  Soft and nontender.  Normoactive bowel  sounds x 4.  No rebound, guarding, rigidity appreciated.   ED Results / Procedures / Treatments   Labs (all labs ordered are listed, but only abnormal results are displayed) Labs Reviewed  COMPREHENSIVE METABOLIC PANEL - Abnormal; Notable for the following components:      Result Value   Glucose, Bld 109 (*)    Creatinine, Ser 1.29 (*)    All other components within normal limits  URINALYSIS, ROUTINE W REFLEX MICROSCOPIC - Abnormal; Notable for the following components:   Color, Urine COLORLESS (*)    APPearance CLEAR (*)    Specific Gravity, Urine 1.001 (*)    All other components within normal limits  LIPASE, BLOOD  CBC    EKG   RADIOLOGY  I personally viewed and evaluated these images as part of my medical decision making, as well as reviewing the written report by the radiologist.  ED Provider Interpretation: Moderate stool burden noted  DG ABD 1 V  IMPRESSION: 1. Moderate stool primarily in the right colon. No rectal distention or bowel obstruction. 2. Known intrarenal calculi.  PROCEDURES:  Critical Care performed: No  Procedures   MEDICATIONS ORDERED IN ED: Medications  ondansetron (ZOFRAN-ODT) disintegrating tablet 4 mg (4 mg Oral Given 04/06/23 1433)  lactulose (CHRONULAC) 10 GM/15ML solution 20 g (20 g Oral Given 04/06/23 1433)     IMPRESSION / MDM / ASSESSMENT AND PLAN / ED COURSE  I reviewed the triage vital signs and the nursing notes.                              Differential diagnosis includes, but is not limited to, acute appendicitis, renal colic, testicular torsion, urinary tract infection/pyelonephritis, prostatitis,  epididymitis, diverticulitis, small bowel obstruction or ileus, colitis, abdominal aortic aneurysm, gastroenteritis, hernia, etc.  Patient's presentation is most consistent with acute complicated illness / injury requiring diagnostic workup.  Patient's diagnosis is consistent with constipation, likely influenced by  regular/chronic opioid use.  Patient presents 5 days since last BM, endorsing some left lower quadrant abdominal discomfort.  He stable on presentation.  X-ray images reviewed by me, reveals primarily right-sided colonic stool without evidence of obstruction or free air.  Patient will be discharged home with prescriptions for lactulose and Zofran. Patient is to follow up with his primary provider with this ED as needed or otherwise directed. Patient is given ED precautions to return to the ED for any worsening or new symptoms.   FINAL CLINICAL IMPRESSION(S) / ED DIAGNOSES   Final diagnoses:  Constipation, unspecified constipation type     Rx / DC Orders   ED Discharge Orders          Ordered    lactulose (CHRONULAC) 10 GM/15ML solution  Daily PRN        04/06/23 1547    ondansetron (ZOFRAN-ODT) 4 MG disintegrating tablet  Every 8 hours PRN        04/06/23 1548             Note:  This document was prepared using Dragon voice recognition software and may include unintentional dictation errors.    Lissa Hoard, PA-C 04/10/23 1937    Willy Eddy, MD 04/11/23 [phone]

## 2023-04-09 ENCOUNTER — Emergency Department
Admission: EM | Admit: 2023-04-09 | Discharge: 2023-04-09 | Disposition: A | Discharge status: Home / Self Care | Attending: Emergency Medicine | Admitting: Emergency Medicine

## 2023-04-09 ENCOUNTER — Emergency Department

## 2023-04-09 ENCOUNTER — Other Ambulatory Visit: Payer: Self-pay

## 2023-04-09 DIAGNOSIS — K5903 Drug induced constipation: Secondary | ICD-10-CM | POA: Insufficient documentation

## 2023-04-09 NOTE — Discharge Instructions (Signed)
Use MiraLAX, or its generic equivalent, for your constipation.  Mix this white powder in your favorite noncarbonated drink, such as milk, water or juice.  To begin with and to get things moving, use 2 capfuls up to twice per day.  Once you are passing more regular bowel movements, cut back to about 1 capful once or twice a day.  It is safe to use suppositories or enemas with this medication.  Please stay hydrated and drink plenty of fluids while you are taking this medication.  

## 2023-04-09 NOTE — ED Provider Notes (Signed)
 [facility] Provider Note    Event Date/Time   First MD Initiated Contact with Patient 04/09/23 0940     (approximate)   History   Constipation   HPI  Carl Solis is a 38 y.o. male who presents to the ED for evaluation of Constipation   I review a PCP visit from 2 weeks ago as well as an ED visit from 3 days ago.  HLD on a statin, chronic pain and insomnia on narcotics, fentanyl patches, oral Dilaudid.  Bilateral hip replacements.  Seen in the ED for constipation had a plain film with moderate stool burden.  Started on lactulose.   Patient presents to the ED due to progression of pain and constipation.  He took 1 dose of lactulose with good effect 2 days ago but has not taken any laxatives since then.  Reports worsening lower abdominal cramping discomfort and emesis that occurred yesterday.  No fevers.   Physical Exam   Triage Vital Signs: ED Triage Vitals  Encounter Vitals Group     BP 04/09/23 0936 (!) 124/93     Systolic BP Percentile --      Diastolic BP Percentile --      Pulse Rate 04/09/23 0936 (!) 115     Resp 04/09/23 0936 18     Temp 04/09/23 0935 (P) 97.8 F (36.6 C)     Temp Source 04/09/23 0935 (P) Oral     SpO2 04/09/23 0936 100 %     Weight 04/09/23 0937 191 lb 12.8 oz (87 kg)     Height 04/09/23 0937 5\' 10"  (1.778 m)     Head Circumference --      Peak Flow --      Pain Score 04/09/23 0936 5     Pain Loc --      Pain Education --      Exclude from Growth Chart --     Most recent vital signs: Vitals:   04/09/23 0935 04/09/23 0936  BP:  (!) 124/93  Pulse:  (!) 115  Resp:  18  Temp: (P) 97.8 F (36.6 C)   SpO2:  100%    General: Awake, no distress.  CV:  Good peripheral perfusion.  Resp:  Normal effort.  Abd:  No distention.  Mild to poorly localizing lower abdominal tenderness without guarding or peritoneal features. MSK:  No deformity noted.  Neuro:  No focal deficits appreciated. Other:     ED  Results / Procedures / Treatments   Labs (all labs ordered are listed, but only abnormal results are displayed) Labs Reviewed - No data to display  EKG   RADIOLOGY CT abdomen/pelvis interpreted me without evidence of acute pathology, moderate stool burden is noted without signs of SBO  Official radiology report(s): CT ABDOMEN PELVIS WO CONTRAST Result Date: 04/09/2023 CLINICAL DATA:  Lower abdominal pain with nausea, vomiting, and constipation EXAM: CT ABDOMEN AND PELVIS WITHOUT CONTRAST TECHNIQUE: Multidetector CT imaging of the abdomen and pelvis was performed following the standard protocol without IV contrast. RADIATION DOSE REDUCTION: This exam was performed according to the departmental dose-optimization program which includes automated exposure control, adjustment of the mA and/or kV according to patient size and/or use of iterative reconstruction technique. COMPARISON:  CT abdomen and pelvis dated 02/27/2023 FINDINGS: Lower chest: No focal consolidation or pulmonary nodule in the lung bases. No pleural effusion or pneumothorax demonstrated. Partially imaged heart size is normal. Hepatobiliary: No focal hepatic lesions. No intra or extrahepatic biliary ductal  dilation. Cholecystectomy. Pancreas: No focal lesions or main ductal dilation. Again seen is suspected splenule adjacent to the pancreatic tail. Spleen: Normal in size without focal abnormality. Adrenals/Urinary Tract: No adrenal nodules. No suspicious renal masses by noncontrast technique. No hydronephrosis. Bilateral punctate nonobstructing calculi. No focal bladder wall thickening. Stomach/Bowel: Normal appearance of the stomach. No evidence of bowel wall thickening, distention, or acute inflammatory changes. Diffuse submucosal hypoattenuation involving the ascending and proximal transverse colon and underdistended rectum may reflect sequela of prior infection/inflammation. Small to moderate volume stool throughout the colon. Normal  appendix. Vascular/Lymphatic: Aortic atherosclerosis. No enlarged abdominal or pelvic lymph nodes. Reproductive: Prostate is unremarkable. Other: No free fluid, fluid collection, or free air. Musculoskeletal: No acute or abnormal lytic or blastic osseous lesions. Bilateral hip arthroplasties. IMPRESSION: 1. No acute abdominopelvic findings. Small to moderate volume stool throughout the colon. 2. Bilateral punctate nonobstructing calculi. 3. Diffuse submucosal hypoattenuation involving the ascending and proximal transverse colon and underdistended rectum may reflect sequela of prior infection/inflammation. 4. Aortic Atherosclerosis (ICD10-I70.0). Electronically Signed   By: Agustin Cree M.D.   On: 04/09/2023 14:13    PROCEDURES and INTERVENTIONS:  Procedures  Medications - No data to display   IMPRESSION / MDM / ASSESSMENT AND PLAN / ED COURSE  I reviewed the triage vital signs and the nursing notes.  Differential diagnosis includes, but is not limited to, drug-induced constipation, SBO, appendicitis  {Patient presents with symptoms of an acute illness or injury that is potentially life-threatening.  Patient presents with continued symptoms of constipation.  Due to his presenting tachycardia, reports of emesis at home, lower abdominal tenderness we do obtain a CT scan without evidence of acute pathology such as acute appendicitis, SBO.  Suspect this is related to constipation in the setting of his chronic opiate use.  He had blood work done a few days ago and I do not feel the need to repeat this.  After reassuring CT scan, we discussed increased doses of MiraLAX and expectant management at home, ED return precautions.  He is suitable for outpatient management  Clinical Course as of 04/09/23 1435  Fri Apr 09, 2023  1426 Reassessed discussed reassuring CT scan, dosing of MiraLAX and medications at home, expectant management and ED return precautions. [DS]    Clinical Course User Index [DS] Delton Prairie, MD     FINAL CLINICAL IMPRESSION(S) / ED DIAGNOSES   Final diagnoses:  Drug-induced constipation     Rx / DC Orders   ED Discharge Orders     None        Note:  This document was prepared using Dragon voice recognition software and may include unintentional dictation errors.   Delton Prairie, MD 04/09/23 [phone]

## 2023-04-09 NOTE — ED Triage Notes (Signed)
 Pt to ED for continued constipation. Was seen recently for same. Reports BM on Wednesday. Has been taking narcotics and lactulose

## 2023-07-26 ENCOUNTER — Ambulatory Visit
